# Patient Record
Sex: Female | Born: 1945 | Race: White | Hispanic: No | Marital: Married | Smoking: Never smoker
Health system: Southern US, Community
[De-identification: ages and names within clinical notes are randomized; demographics above are authoritative.]

## PROBLEM LIST (undated history)

## (undated) DIAGNOSIS — E039 Hypothyroidism, unspecified: Secondary | ICD-10-CM

## (undated) DIAGNOSIS — E119 Type 2 diabetes mellitus without complications: Secondary | ICD-10-CM

## (undated) HISTORY — DX: Hypothyroidism, unspecified: E03.9

## (undated) HISTORY — DX: Type 2 diabetes mellitus without complications: E11.9

## (undated) HISTORY — PX: CHOLECYSTECTOMY: SHX55

## (undated) HISTORY — PX: ABDOMINAL HYSTERECTOMY: SHX81

---

## 2017-09-30 LAB — TSH: TSH: 3.42 (ref <=5.90)

## 2018-01-10 LAB — LIPID PANEL
Cholesterol: 232 — AB (ref 0–200)
HDL: 51 (ref 35–70)
LDL CALC: 154
TRIGLYCERIDES: 161 — AB (ref 40–160)

## 2018-02-17 LAB — HEMOGLOBIN A1C: HEMOGLOBIN A1C: 7.5

## 2018-02-17 LAB — LIPID PANEL
Cholesterol: 201 — AB (ref 0–200)
HDL: 52 (ref 35–70)
LDL Cholesterol: 138
Triglycerides: 167 — AB (ref 40–160)

## 2018-02-17 LAB — BASIC METABOLIC PANEL
BUN: 13 (ref 4–21)
Creatinine: 1.3 — AB (ref 0.5–1.1)

## 2018-04-20 ENCOUNTER — Ambulatory Visit (INDEPENDENT_AMBULATORY_CARE_PROVIDER_SITE_OTHER): Payer: Medicare Other | Admitting: "Endocrinology

## 2018-04-20 ENCOUNTER — Encounter: Payer: Self-pay | Admitting: "Endocrinology

## 2018-04-20 VITALS — BP 119/71 | HR 84 | Ht 64.0 in | Wt 201.0 lb

## 2018-04-20 DIAGNOSIS — E559 Vitamin D deficiency, unspecified: Secondary | ICD-10-CM | POA: Diagnosis not present

## 2018-04-20 DIAGNOSIS — E039 Hypothyroidism, unspecified: Secondary | ICD-10-CM

## 2018-04-20 DIAGNOSIS — E782 Mixed hyperlipidemia: Secondary | ICD-10-CM | POA: Insufficient documentation

## 2018-04-20 DIAGNOSIS — I10 Essential (primary) hypertension: Secondary | ICD-10-CM

## 2018-04-20 DIAGNOSIS — E1165 Type 2 diabetes mellitus with hyperglycemia: Secondary | ICD-10-CM | POA: Diagnosis not present

## 2018-04-20 MED ORDER — LEVOTHYROXINE SODIUM 75 MCG PO TABS: 75.0000 ug | ORAL_TABLET | Freq: Every day | ORAL | 1 refills | Status: DC

## 2018-04-20 MED ORDER — METFORMIN HCL ER 500 MG PO TB24: 500.0000 mg | ORAL_TABLET | Freq: Every day | ORAL | 2 refills | Status: DC

## 2018-04-20 MED ORDER — ATORVASTATIN CALCIUM 20 MG PO TABS: 20.0000 mg | ORAL_TABLET | Freq: Every day | ORAL | 1 refills | Status: DC

## 2018-04-20 NOTE — Progress Notes (Signed)
Endocrinology Consult Note       04/20/2018, 11:32 AM   Subjective:    Patient ID: Carol Adams, female    DOB: [birth year].  Carol Adams is being seen in consultation for management of currently uncontrolled symptomatic diabetes requested by  Lamont Snowball, MD.   Past Medical History:  Diagnosis Date  . Diabetes mellitus, type II (HCC)   . Hypothyroidism    Past Surgical History:  Procedure Laterality Date  . ABDOMINAL HYSTERECTOMY    . CHOLECYSTECTOMY     Social History   Socioeconomic History  . Marital status: Married    Spouse name: Not on file  . Number of children: Not on file  . Years of education: Not on file  . Highest education level: Not on file  Occupational History  . Not on file  Social Needs  . Financial resource strain: Not on file  . Food insecurity:    Worry: Not on file    Inability: Not on file  . Transportation needs:    Medical: Not on file    Non-medical: Not on file  Tobacco Use  . Smoking status: Never Smoker  . Smokeless tobacco: Never Used  Substance and Sexual Activity  . Alcohol use: Never    Frequency: Never  . Drug use: Never  . Sexual activity: Not on file  Lifestyle  . Physical activity:    Days per week: Not on file    Minutes per session: Not on file  . Stress: Not on file  Relationships  . Social connections:    Talks on phone: Not on file    Gets together: Not on file    Attends religious service: Not on file    Active member of club or organization: Not on file    Attends meetings of clubs or organizations: Not on file    Relationship status: Not on file  Other Topics Concern  . Not on file  Social History Narrative  . Not on file   Outpatient Encounter Medications as of 04/20/2018  Medication Sig  . loratadine (CLARITIN) 10 MG tablet Take 10 mg by mouth daily.  . Multiple Vitamin (MULTIVITAMIN) capsule Take 1 capsule by mouth daily.   Marland Kitchen PARoxetine (PAXIL) 20 MG tablet Take 20 mg by mouth daily.  . Vitamin D, Ergocalciferol, 2000 units CAPS Take by mouth daily.  Marland Kitchen atorvastatin (LIPITOR) 20 MG tablet Take 1 tablet (20 mg total) by mouth daily.  Marland Kitchen levothyroxine (SYNTHROID, LEVOTHROID) 75 MCG tablet Take 1 tablet (75 mcg total) by mouth daily before breakfast.  . metFORMIN (GLUCOPHAGE XR) 500 MG 24 hr tablet Take 1 tablet (500 mg total) by mouth daily with breakfast.  . omeprazole (PRILOSEC) 20 MG capsule daily.  . trandolapril (MAVIK) 4 MG tablet daily as needed.  . [DISCONTINUED] levothyroxine (SYNTHROID, LEVOTHROID) 50 MCG tablet daily.  . [DISCONTINUED] metFORMIN (GLUCOPHAGE) 500 MG tablet 2 (two) times daily.   No facility-administered encounter medications on file as of 04/20/2018.     ALLERGIES: Allergies  Allergen Reactions  . Cephalexin Rash    VACCINATION STATUS:  There is no immunization history on file  for this patient.  Diabetes  She presents for her initial diabetic visit. She has type 2 diabetes mellitus. Onset time: She was recently diagnosed with type 2 diabetes at age 68 years. Her disease course has been improving. There are no hypoglycemic associated symptoms. Pertinent negatives for hypoglycemia include no confusion, headaches, pallor or seizures. There are no diabetic associated symptoms. Pertinent negatives for diabetes include no chest pain, no polydipsia, no polyphagia and no polyuria. There are no hypoglycemic complications. Symptoms are stable. Diabetic complications include nephropathy. Risk factors for coronary artery disease include diabetes mellitus, dyslipidemia, hypertension, obesity, sedentary lifestyle and post-menopausal. Current diabetic treatment includes oral agent (monotherapy). Her weight is fluctuating minimally. She is following a generally unhealthy diet. When asked about meal planning, she reported none. She has not had a previous visit with a dietitian. She participates in  exercise intermittently. Her home blood glucose trend is decreasing steadily. Her overall blood glucose range is 130-140 mg/dl. An ACE inhibitor/angiotensin II receptor blocker is being taken.  Hyperlipidemia  This is a chronic problem. The current episode started more than 1 year ago. The problem is uncontrolled. Recent lipid tests were reviewed and are variable. Exacerbating diseases include diabetes, hypothyroidism and obesity. Pertinent negatives include no chest pain, myalgias or shortness of breath. She is currently on no antihyperlipidemic treatment. Risk factors for coronary artery disease include dyslipidemia, diabetes mellitus, hypertension, obesity, post-menopausal, a sedentary lifestyle and family history.  Hypertension  This is a chronic problem. The current episode started more than 1 year ago. Pertinent negatives include no chest pain, headaches, palpitations or shortness of breath. Risk factors for coronary artery disease include diabetes mellitus, dyslipidemia and sedentary lifestyle. Past treatments include ACE inhibitors. Hypertensive end-organ damage includes kidney disease.      Review of Systems  Constitutional: Negative for chills, fever and unexpected weight change.  HENT: Negative for trouble swallowing and voice change.   Eyes: Negative for visual disturbance.  Respiratory: Negative for cough, shortness of breath and wheezing.   Cardiovascular: Negative for chest pain, palpitations and leg swelling.  Gastrointestinal: Negative for diarrhea, nausea and vomiting.  Endocrine: Negative for cold intolerance, heat intolerance, polydipsia, polyphagia and polyuria.  Musculoskeletal: Negative for arthralgias and myalgias.  Skin: Negative for color change, pallor, rash and wound.  Neurological: Negative for seizures and headaches.  Psychiatric/Behavioral: Negative for confusion and suicidal ideas.    Objective:    BP 119/71   Pulse 84   Ht 5\' 4"  (1.626 m)   Wt 201 lb (91.2  kg)   BMI 34.50 kg/m   Wt Readings from Last 3 Encounters:  04/20/18 201 lb (91.2 kg)     Physical Exam  Constitutional: She is oriented to person, place, and time. She appears well-developed.  HENT:  Head: Normocephalic and atraumatic.  Eyes: EOM are normal.  Neck: Normal range of motion. Neck supple. No tracheal deviation present. No thyromegaly present.  Cardiovascular: Normal rate and regular rhythm.  Pulmonary/Chest: Effort normal and breath sounds normal.  Abdominal: Soft. Bowel sounds are normal. There is no tenderness. There is no guarding.  Musculoskeletal: Normal range of motion. She exhibits no edema.  Neurological: She is alert and oriented to person, place, and time. She has normal reflexes. No cranial nerve deficit. Coordination normal.  Skin: Skin is warm and dry. No rash noted. No erythema. No pallor.  Psychiatric: She has a normal mood and affect. Judgment normal.    Recent Results (from the past 2160 hour(s))  Basic metabolic panel  Status: Abnormal   Collection Time: 02/17/18 12:00 AM  Result Value Ref Range   BUN 13 4 - 21   Creatinine 1.3 (A) 0.5 - 1.1  Lipid panel     Status: Abnormal   Collection Time: 02/17/18 12:00 AM  Result Value Ref Range   Triglycerides 167 (A) 40 - 160   Cholesterol 201 (A) 0 - 200   HDL 52 35 - 70   LDL Cholesterol 138   Hemoglobin A1c     Status: None   Collection Time: 02/17/18 12:00 AM  Result Value Ref Range   Hemoglobin A1C 7.5      Assessment & Plan:   1. Uncontrolled type 2 diabetes mellitus with hyperglycemia (HCC)  - Carol Adams has currently uncontrolled symptomatic type 2 DM since 72 years of age,  with most recent A1c of 7.5 %. Recent labs reviewed.  -her diabetes is complicated by stage 3 renal insufficiency, obesity/sedentary life and she remains at a high risk for more acute and chronic complications which include CAD, CVA, CKD, retinopathy, and neuropathy. These are all discussed in detail with her.  -  I have counseled her on diet management and weight loss, by adopting a carbohydrate restricted/protein rich diet.  - Suggestion is made for her to avoid simple carbohydrates  from her diet including Cakes, Sweet Desserts, Ice Cream, Soda (diet and regular), Sweet Tea, Candies, Chips, Cookies, Store Bought Juices, Alcohol in Excess of  1-2 drinks a day, Artificial Sweeteners,  Coffee Creamer, and "Sugar-free" Products. This will help patient to have more stable blood glucose profile and potentially avoid unintended weight gain.  - I encouraged her to switch to  unprocessed or minimally processed complex starch and increased protein intake (animal or plant source), fruits, and vegetables.  - she is advised to stick to a routine mealtimes to eat 3 meals  a day and avoid unnecessary snacks ( to snack only to correct hypoglycemia).   - she will be scheduled with Norm Salt, RDN, CDE for individualized diabetes education.  - I have approached her with the following individualized plan to manage diabetes and patient agrees:   -Given her recent diagnosis of diabetes and near target glycemic profile with A1c of 7.5%, she would not require insulin treatment at this time.   -She is advised to maintain adequate hydration, avoid over-the-counter NSAIDs to stabilize her kidney function.   -She will continue to benefit from metformin therapy.  I discussed and switched her metformin to extended release form to take 500 mg ER twice a day after breakfast and supper.    -She will return in 9 weeks with repeat A1c, will be considered for GLP-1 receptor agonists if she cannot tolerate metformin and/ or if her A1c continues to increase.  -She is advised to take a break from finger sticking at this time.  - she is not a candidate for SGLT2 inhibitors due to CKD.  - she will be considered for incretin therapy as appropriate next visit. - Patient specific target  A1c;  LDL, HDL, Triglycerides, and  Waist  Circumference were discussed in detail.  2) BP/HTN:  her blood pressure is controlled to target.   she is advised to continue her current medications including trandolapril 4 mg p.o. daily with breakfast . 3) Lipids/HPL:   Review of her recent lipid panel showed uncontrolled  LDL at 138.  she will benefit from statin therapy.  I discussed and initiated atorvastatin 20 mg p.o. nightly.  Side effects  and precautions discussed with her.  4)  Weight/Diet:  Body mass index is 34.5 kg/m.  - clearly complicating her diabetes care.  I discussed with her the fact that loss of 5 - 10% of her  current body weight will have the most impact on her diabetes management.  CDE Consult will be initiated . Exercise, and detailed carbohydrates information provided  -  detailed on discharge instructions.  5) hypothyroidism-long-term diagnosis, took levothyroxine for more than 10 years. -Recent labs showed high normal TSH of 3.4.  Benefit from a higher dose of levothyroxine. -I discussed and increase her levothyroxine to 75 mcg p.o. every morning.  - We discussed about correct intake of levothyroxine, at fasting, with water, separated by at least 30 minutes from breakfast, and separated by more than 4 hours from calcium, iron, multivitamins, acid reflux medications (PPIs). -Patient is made aware of the fact that thyroid hormone replacement is needed for life, dose to be adjusted by periodic monitoring of thyroid function tests.  5) Chronic Care/Health Maintenance:  -she  is on ACEI/ARB and Statin medications and  is encouraged to initiate and continue to follow up with Ophthalmology, Dentist,  Podiatrist at least yearly or according to recommendations, and advised to  stay away from smoking. I have recommended yearly flu vaccine and pneumonia vaccine at least every 5 years; moderate intensity exercise for up to 150 minutes weekly; and  sleep for at least 7 hours a day.  - I advised patient to maintain close follow up  with Lamont Snowball, MD for primary care needs.  - Time spent with the patient: 45 minutes, of which >50% was spent in obtaining information about her symptoms, reviewing her previous labs, evaluations, and treatments, counseling her about her type 2 diabetes, hypothyroidism, hyperlipidemia, hypertension, and developing plans for long term treatment based on the latest recommendations.  Carol Adams participated in the discussions, expressed understanding, and voiced agreement with the above plans.  All questions were answered to her satisfaction. she is encouraged to contact clinic should she have any questions or concerns prior to her return visit.  Follow up plan: - Return in about 9 weeks (around 06/22/2018) for Follow up with Pre-visit Labs.  Marquis Lunch, MD [facility] [address] [place], [place] [address] Phone: [phone]  Fax: [phone]    04/20/2018, 11:32 AM  This note was partially dictated with voice recognition software. Similar sounding words can be transcribed inadequately or may not  be corrected upon review.

## 2018-04-20 NOTE — Patient Instructions (Signed)

## 2018-06-15 ENCOUNTER — Other Ambulatory Visit: Payer: Self-pay | Admitting: "Endocrinology

## 2018-06-16 ENCOUNTER — Other Ambulatory Visit: Payer: Self-pay

## 2018-06-16 ENCOUNTER — Other Ambulatory Visit: Payer: Self-pay | Admitting: "Endocrinology

## 2018-06-16 LAB — CMP14+EGFR
A/G RATIO: 1.8 (ref 1.2–2.2)
ALBUMIN: 4.4 g/dL (ref 3.5–4.8)
ALT: 23 IU/L (ref 0–32)
AST: 20 IU/L (ref 0–40)
Alkaline Phosphatase: 100 IU/L (ref 39–117)
BUN / CREAT RATIO: 15 (ref 12–28)
BUN: 18 mg/dL (ref 8–27)
Bilirubin Total: 0.5 mg/dL (ref 0.0–1.2)
CALCIUM: 8.8 mg/dL (ref 8.7–10.3)
CO2: 24 mmol/L (ref 20–29)
Chloride: 103 mmol/L (ref 96–106)
Creatinine, Ser: 1.22 mg/dL — ABNORMAL HIGH (ref 0.57–1.00)
GFR, EST AFRICAN AMERICAN: 51 mL/min/{1.73_m2} — AB (ref >59)
GFR, EST NON AFRICAN AMERICAN: 44 mL/min/{1.73_m2} — AB (ref >59)
GLUCOSE: 143 mg/dL — AB (ref 65–99)
Globulin, Total: 2.5 g/dL (ref 1.5–4.5)
Potassium: 4.3 mmol/L (ref 3.5–5.2)
Sodium: 141 mmol/L (ref 134–144)
TOTAL PROTEIN: 6.9 g/dL (ref 6.0–8.5)

## 2018-06-16 LAB — MICROALBUMIN / CREATININE URINE RATIO
CREATININE, UR: 158.4 mg/dL
Microalb/Creat Ratio: 8.6 mg/g creat (ref 0.0–30.0)
Microalbumin, Urine: 13.6 ug/mL

## 2018-06-16 LAB — THYROGLOBULIN ANTIBODY: Thyroglobulin Antibody: <1 IU/mL (ref 0.0–0.9)

## 2018-06-16 LAB — THYROID PEROXIDASE ANTIBODY: Thyroperoxidase Ab SerPl-aCnc: 9 IU/mL (ref 0–34)

## 2018-06-16 LAB — T4, FREE: Free T4: 1.63 ng/dL (ref 0.82–1.77)

## 2018-06-16 LAB — HGB A1C W/O EAG: Hgb A1c MFr Bld: 7.1 % — ABNORMAL HIGH (ref 4.8–5.6)

## 2018-06-16 LAB — VITAMIN D 25 HYDROXY (VIT D DEFICIENCY, FRACTURES): Vit D, 25-Hydroxy: 55.8 ng/mL (ref 30.0–100.0)

## 2018-06-16 LAB — TSH: TSH: 1.53 u[IU]/mL (ref 0.450–4.500)

## 2018-06-16 MED ORDER — ATORVASTATIN CALCIUM 20 MG PO TABS: 20.0000 mg | ORAL_TABLET | Freq: Every day | ORAL | 1 refills | Status: DC

## 2018-06-24 ENCOUNTER — Ambulatory Visit (INDEPENDENT_AMBULATORY_CARE_PROVIDER_SITE_OTHER): Payer: Medicare Other | Admitting: "Endocrinology

## 2018-06-24 ENCOUNTER — Encounter: Payer: Self-pay | Admitting: "Endocrinology

## 2018-06-24 VITALS — BP 143/80 | HR 76 | Resp 14 | Ht 64.0 in | Wt 201.2 lb

## 2018-06-24 DIAGNOSIS — N183 Chronic kidney disease, stage 3 unspecified: Secondary | ICD-10-CM

## 2018-06-24 DIAGNOSIS — E039 Hypothyroidism, unspecified: Secondary | ICD-10-CM | POA: Diagnosis not present

## 2018-06-24 DIAGNOSIS — I10 Essential (primary) hypertension: Secondary | ICD-10-CM | POA: Diagnosis not present

## 2018-06-24 DIAGNOSIS — E782 Mixed hyperlipidemia: Secondary | ICD-10-CM | POA: Diagnosis not present

## 2018-06-24 DIAGNOSIS — E1122 Type 2 diabetes mellitus with diabetic chronic kidney disease: Secondary | ICD-10-CM

## 2018-06-24 NOTE — Progress Notes (Signed)
Endocrinology follow up Note       06/24/2018, 11:07 AM   Subjective:    Patient ID: Carol Adams, female    DOB: [birth year].  Carol Adams is being seen in consultation for management of currently uncontrolled symptomatic type 2 diabetes, hypothyroidism, hyperlipidemia, hypertension. PMD:   Tommie Sams, MD.   Past Medical History:  Diagnosis Date  . Diabetes mellitus, type II ([place])   . Hypothyroidism    Past Surgical History:  Procedure Laterality Date  . ABDOMINAL HYSTERECTOMY    . CHOLECYSTECTOMY     Social History   Socioeconomic History  . Marital status: Married    Spouse name: Not on file  . Number of children: Not on file  . Years of education: Not on file  . Highest education level: Not on file  Occupational History  . Not on file  Social Needs  . Financial resource strain: Not on file  . Food insecurity:    Worry: Not on file    Inability: Not on file  . Transportation needs:    Medical: Not on file    Non-medical: Not on file  Tobacco Use  . Smoking status: Never Smoker  . Smokeless tobacco: Never Used  Substance and Sexual Activity  . Alcohol use: Never    Frequency: Never  . Drug use: Never  . Sexual activity: Not on file  Lifestyle  . Physical activity:    Days per week: Not on file    Minutes per session: Not on file  . Stress: Not on file  Relationships  . Social connections:    Talks on phone: Not on file    Gets together: Not on file    Attends religious service: Not on file    Active member of club or organization: Not on file    Attends meetings of clubs or organizations: Not on file    Relationship status: Not on file  Other Topics Concern  . Not on file  Social History Narrative  . Not on file   Outpatient Encounter Medications as of 06/24/2018  Medication Sig  . atorvastatin (LIPITOR) 20 MG tablet TAKE 1 TABLET(20 MG) BY MOUTH DAILY  .  levothyroxine (SYNTHROID, LEVOTHROID) 75 MCG tablet Take 1 tablet (75 mcg total) by mouth daily before breakfast.  . loratadine (CLARITIN) 10 MG tablet Take 10 mg by mouth daily.  . metFORMIN (GLUCOPHAGE XR) 500 MG 24 hr tablet Take 1 tablet (500 mg total) by mouth daily with breakfast.  . Multiple Vitamin (MULTIVITAMIN) capsule Take 1 capsule by mouth daily.  Marland Kitchen omeprazole (PRILOSEC) 20 MG capsule daily.  Marland Kitchen PARoxetine (PAXIL) 20 MG tablet Take 20 mg by mouth daily.  . trandolapril (MAVIK) 4 MG tablet daily as needed.  . Vitamin D, Ergocalciferol, 2000 units CAPS Take by mouth daily.   No facility-administered encounter medications on file as of 06/24/2018.     ALLERGIES: Allergies  Allergen Reactions  . Cephalexin Rash    VACCINATION STATUS:  There is no immunization history on file for this patient.  Diabetes  She presents for her follow-up diabetic visit. She has type 2 diabetes  mellitus. Onset time: She was recently diagnosed with type 2 diabetes at age 1 years. Her disease course has been improving. There are no hypoglycemic associated symptoms. Pertinent negatives for hypoglycemia include no confusion, headaches, pallor or seizures. There are no diabetic associated symptoms. Pertinent negatives for diabetes include no chest pain, no polydipsia, no polyphagia and no polyuria. There are no hypoglycemic complications. Symptoms are stable. Diabetic complications include nephropathy. Risk factors for coronary artery disease include diabetes mellitus, dyslipidemia, hypertension, obesity, sedentary lifestyle and post-menopausal. Current diabetic treatment includes oral agent (monotherapy). Her weight is stable. She is following a generally unhealthy diet. When asked about meal planning, she reported none. She has not had a previous visit with a dietitian. She participates in exercise intermittently. Her home blood glucose trend is decreasing steadily. An ACE inhibitor/angiotensin II receptor  blocker is being taken.  Hyperlipidemia  This is a chronic problem. The current episode started more than 1 year ago. The problem is uncontrolled. Recent lipid tests were reviewed and are variable. Exacerbating diseases include diabetes, hypothyroidism and obesity. Pertinent negatives include no chest pain, myalgias or shortness of breath. She is currently on no antihyperlipidemic treatment. Risk factors for coronary artery disease include dyslipidemia, diabetes mellitus, hypertension, obesity, post-menopausal, a sedentary lifestyle and family history.  Hypertension  This is a chronic problem. The current episode started more than 1 year ago. Pertinent negatives include no chest pain, headaches, palpitations or shortness of breath. Risk factors for coronary artery disease include diabetes mellitus, dyslipidemia and sedentary lifestyle. Past treatments include ACE inhibitors. Hypertensive end-organ damage includes kidney disease.      Review of Systems  Constitutional: Negative for chills, fever and unexpected weight change.  HENT: Negative for trouble swallowing and voice change.   Eyes: Negative for visual disturbance.  Respiratory: Negative for cough, shortness of breath and wheezing.   Cardiovascular: Negative for chest pain, palpitations and leg swelling.  Gastrointestinal: Negative for diarrhea, nausea and vomiting.  Endocrine: Negative for cold intolerance, heat intolerance, polydipsia, polyphagia and polyuria.  Musculoskeletal: Negative for arthralgias and myalgias.  Skin: Negative for color change, pallor, rash and wound.  Neurological: Negative for seizures and headaches.  Psychiatric/Behavioral: Negative for confusion and suicidal ideas.    Objective:    BP (!) 143/80 (BP Location: Left Arm, Patient Position: Sitting, Cuff Size: Large)   Pulse 76   Resp 14   Ht 5' 4"  (1.626 m)   Wt 201 lb 3.2 oz (91.3 kg)   SpO2 98% Comment: room air  BMI 34.54 kg/m   Wt Readings from Last  3 Encounters:  06/24/18 201 lb 3.2 oz (91.3 kg)  04/20/18 201 lb (91.2 kg)     Physical Exam Constitutional:      Appearance: She is well-developed.  HENT:     Head: Normocephalic and atraumatic.  Neck:     Musculoskeletal: Normal range of motion and neck supple.     Thyroid: No thyromegaly.     Trachea: No tracheal deviation.  Pulmonary:     Effort: Pulmonary effort is normal.  Abdominal:     Tenderness: There is no abdominal tenderness. There is no guarding.  Musculoskeletal: Normal range of motion.  Skin:    General: Skin is warm and dry.     Coloration: Skin is not pale.     Findings: No erythema or rash.  Neurological:     Mental Status: She is alert and oriented to person, place, and time.     Cranial Nerves: No cranial  nerve deficit.     Coordination: Coordination normal.     Deep Tendon Reflexes: Reflexes are normal and symmetric.  Psychiatric:        Judgment: Judgment normal.     Recent Results (from the past 2160 hour(s))  CMP14+EGFR     Status: Abnormal   Collection Time: 06/15/18  9:41 AM  Result Value Ref Range   Glucose 143 (H) 65 - 99 mg/dL   BUN 18 8 - 27 mg/dL   Creatinine, Ser 1.22 (H) 0.57 - 1.00 mg/dL   GFR calc non Af Amer 44 (L) >59 mL/min/1.73   GFR calc Af Amer 51 (L) >59 mL/min/1.73   BUN/Creatinine Ratio 15 12 - 28   Sodium 141 134 - 144 mmol/L   Potassium 4.3 3.5 - 5.2 mmol/L   Chloride 103 96 - 106 mmol/L   CO2 24 20 - 29 mmol/L   Calcium 8.8 8.7 - 10.3 mg/dL   Total Protein 6.9 6.0 - 8.5 g/dL   Albumin 4.4 3.5 - 4.8 g/dL   Globulin, Total 2.5 1.5 - 4.5 g/dL   Albumin/Globulin Ratio 1.8 1.2 - 2.2   Bilirubin Total 0.5 0.0 - 1.2 mg/dL   Alkaline Phosphatase 100 39 - 117 IU/L   AST 20 0 - 40 IU/L   ALT 23 0 - 32 IU/L  Microalbumin / creatinine urine ratio     Status: None   Collection Time: 06/15/18  9:41 AM  Result Value Ref Range   Creatinine, Urine 158.4 Not Estab. mg/dL   Microalbumin, Urine 13.6 Not Estab. ug/mL    Microalb/Creat Ratio 8.6 0.0 - 30.0 mg/g creat    Comment:                      Normal:                0.0 -  30.0                      Albuminuria:          31.0 - 300.0                      Clinical albuminuria:       >300.0   Hgb A1c w/o eAG     Status: Abnormal   Collection Time: 06/15/18  9:41 AM  Result Value Ref Range   Hgb A1c MFr Bld 7.1 (H) 4.8 - 5.6 %    Comment:          Prediabetes: 5.7 - 6.4          Diabetes: >6.4          Glycemic control for adults with diabetes: <7.0   T4, free     Status: None   Collection Time: 06/15/18  9:41 AM  Result Value Ref Range   Free T4 1.63 0.82 - 1.77 ng/dL  TSH     Status: None   Collection Time: 06/15/18  9:41 AM  Result Value Ref Range   TSH 1.530 0.450 - 4.500 uIU/mL  VITAMIN D 25 Hydroxy (Vit-D Deficiency, Fractures)     Status: None   Collection Time: 06/15/18  9:41 AM  Result Value Ref Range   Vit D, 25-Hydroxy 55.8 30.0 - 100.0 ng/mL    Comment: Vitamin D deficiency has been defined by the Institute of Medicine and an Endocrine Society practice guideline as a level of serum 25-OH vitamin D less than  20 ng/mL (1,2). The Endocrine Society went on to further define vitamin D insufficiency as a level between 21 and 29 ng/mL (2). 1. IOM (Institute of Medicine). 2010. Dietary reference    intakes for calcium and D. [place]: The    [employer]. 2. Holick MF, Binkley Throckmorton, Bischoff-Ferrari HA, et al.    Evaluation, treatment, and prevention of vitamin D    deficiency: an Endocrine Society clinical practice    guideline. JCEM. 2011 Jul; 96(7):1911-30.   Thyroglobulin antibody     Status: None   Collection Time: 06/15/18  9:41 AM  Result Value Ref Range   Thyroglobulin Antibody <1.0 0.0 - 0.9 IU/mL    Comment: Thyroglobulin Antibody measured by [employer]man Coulter Methodology  Thyroid peroxidase antibody     Status: None   Collection Time: 06/15/18  9:41 AM  Result Value Ref Range   Thyroperoxidase Ab SerPl-aCnc  9 0 - 34 IU/mL     Assessment & Plan:   1. Uncontrolled type 2 diabetes mellitus with Stage 3 renal   - Matrice Herro has currently uncontrolled symptomatic type 2 DM since 72 years of age.  She returns with improved A1c was 7.1% from 7.5%.  Her recent labs show stage 3 renal insufficiency which is showing improvement.  -her diabetes is complicated by stag e 3 renal insufficiency,obesity/sedentary life and she remains at a high risk for more acute and chronic complications which include CAD, CVA, CKD, retinopathy, and neuropathy. These are all discussed in detail with her.  - I have counseled her on diet management and weight loss, by adopting a carbohydrate restricted/protein rich diet.  -  Suggestion is made for her to avoid simple carbohydrates  from her diet including Cakes, Sweet Desserts / Pastries, Ice Cream, Soda (diet and regular), Sweet Tea, Candies, Chips, Cookies, Store Bought Juices, Alcohol in Excess of  1-2 drinks a day, Artificial Sweeteners, and "Sugar-free" Products. This will help patient to have stable blood glucose profile and potentially avoid unintended weight gain.  - I encouraged her to switch to  unprocessed or minimally processed complex starch and increased protein intake (animal or plant source), fruits, and vegetables.  - she is advised to stick to a routine mealtimes to eat 3 meals  a day and avoid unnecessary snacks ( to snack only to correct hypoglycemia).   - I have approached her with the following individualized plan to manage diabetes and patient agrees:   -Given her recent diagnosis of diabetes and near target glycemic profile with A1c of 7.1%, she will not require insulin treatment at this time.   -She is advised to maintain adequate hydration, avoid over-the-counter NSAIDs to stabilize her kidney function.   -She will continue to benefit from metformin therapy.  I discussed and continued  metformin extended release  500 mg ER once a day after breakfast and  supper.    -She  will be considered for GLP-1 receptor agonists if she cannot tolerate metformin and/ or if her A1c continues to increase.  -She is advised to take a break from finger sticking at this time.  - she is not a candidate for SGLT2 inhibitors due to CKD.  - she will be considered for incretin therapy as appropriate next visit. - Patient specific target  A1c;  LDL, HDL, Triglycerides, and  Waist Circumference were discussed in detail.  2) BP/HTN:  her blood pressure is not controlled to target.  she is advised to continue her current medications including trandolapril 4  mg p.o. daily with breakfast . 3) Lipids/HPL:   Review of her recent lipid panel showed uncontrolled  LDL at 138.  she will benefit from statin therapy.  She is tolerating atorvastatin 20 mg p.o. nightly.  She will be considered for fasting lipid panel on subsequent visits.  4)  Weight/Diet:  Body mass index is 34.54 kg/m.  - clearly complicating her diabetes care.  I discussed with her the fact that loss of 5 - 10% of her  current body weight will have the most impact on her diabetes management.  CDE Consult has been initiated . Exercise, and detailed carbohydrates information provided  -  detailed on discharge instructions.  5) hypothyroidism-long-term diagnosis, took levothyroxine for more than 10 years. -Her thyroid function tests are consistent with appropriate replacement. -He is advised to continue  levothyroxine to 75 mcg p.o. every morning.  - We discussed about correct intake of levothyroxine, at fasting, with water, separated by at least 30 minutes from breakfast, and separated by more than 4 hours from calcium, iron, multivitamins, acid reflux medications (PPIs). -Patient is made aware of the fact that thyroid hormone replacement is needed for life, dose to be adjusted by periodic monitoring of thyroid function tests.  5) Chronic Care/Health Maintenance:  -she  is on ACEI/ARB and Statin medications and  is  encouraged to initiate and continue to follow up with Ophthalmology, Dentist,  Podiatrist at least yearly or according to recommendations, and advised to  stay away from smoking. I have recommended yearly flu vaccine and pneumonia vaccine at least every 5 years; moderate intensity exercise for up to 150 minutes weekly; and  sleep for at least 7 hours a day.  - I advised patient to maintain close follow up with Tommie Sams, MD for primary care needs.  - Time spent with the patient: 25 min, of which >50% was spent in reviewing her  current and  previous labs, previous treatments, and medications doses and developing a plan for long-term care.  Candise Bowens participated in the discussions, expressed understanding, and voiced agreement with the above plans.  All questions were answered to her satisfaction. she is encouraged to contact clinic should she have any questions or concerns prior to her return visit.  Follow up plan: - Return in about 4 months (around 10/24/2018) for Follow up with Pre-visit Labs.  Glade Lloyd, MD [facility] [address] [place], [place] [address] Phone: [phone]  Fax: [phone]    06/24/2018, 11:07 AM  This note was partially dictated with voice recognition software. Similar sounding words can be transcribed inadequately or may not  be corrected upon review.

## 2018-06-24 NOTE — Patient Instructions (Signed)

## 2018-10-17 ENCOUNTER — Other Ambulatory Visit: Payer: Self-pay | Admitting: "Endocrinology

## 2018-10-18 LAB — COMPREHENSIVE METABOLIC PANEL
ALT: 23 IU/L (ref 0–32)
AST: 19 IU/L (ref 0–40)
Albumin/Globulin Ratio: 1.6 (ref 1.2–2.2)
Albumin: 4.3 g/dL (ref 3.7–4.7)
Alkaline Phosphatase: 116 IU/L (ref 39–117)
BUN/Creatinine Ratio: 14 (ref 12–28)
BUN: 18 mg/dL (ref 8–27)
Bilirubin Total: 0.5 mg/dL (ref 0.0–1.2)
CO2: 24 mmol/L (ref 20–29)
Calcium: 9.2 mg/dL (ref 8.7–10.3)
Chloride: 102 mmol/L (ref 96–106)
Creatinine, Ser: 1.3 mg/dL — ABNORMAL HIGH (ref 0.57–1.00)
GFR calc Af Amer: 47 mL/min/{1.73_m2} — ABNORMAL LOW (ref >59)
GFR calc non Af Amer: 41 mL/min/{1.73_m2} — ABNORMAL LOW (ref >59)
Globulin, Total: 2.7 g/dL (ref 1.5–4.5)
Glucose: 145 mg/dL — ABNORMAL HIGH (ref 65–99)
Potassium: 4.4 mmol/L (ref 3.5–5.2)
Sodium: 142 mmol/L (ref 134–144)
Total Protein: 7 g/dL (ref 6.0–8.5)

## 2018-10-18 LAB — TSH+FREE T4
Free T4: 1.6 ng/dL (ref 0.82–1.77)
TSH: 1.5 u[IU]/mL (ref 0.450–4.500)

## 2018-10-18 LAB — HGB A1C W/O EAG: Hgb A1c MFr Bld: 7.2 % — ABNORMAL HIGH (ref 4.8–5.6)

## 2018-10-18 LAB — HEMOGLOBIN A1C: Hemoglobin A1C: 7.2

## 2018-10-24 ENCOUNTER — Other Ambulatory Visit: Payer: Self-pay

## 2018-10-24 ENCOUNTER — Encounter: Payer: Self-pay | Admitting: "Endocrinology

## 2018-10-24 ENCOUNTER — Other Ambulatory Visit: Payer: Self-pay | Admitting: "Endocrinology

## 2018-10-24 ENCOUNTER — Ambulatory Visit (INDEPENDENT_AMBULATORY_CARE_PROVIDER_SITE_OTHER): Payer: Medicare Other | Admitting: "Endocrinology

## 2018-10-24 DIAGNOSIS — E1122 Type 2 diabetes mellitus with diabetic chronic kidney disease: Secondary | ICD-10-CM | POA: Diagnosis not present

## 2018-10-24 DIAGNOSIS — E039 Hypothyroidism, unspecified: Secondary | ICD-10-CM | POA: Diagnosis not present

## 2018-10-24 DIAGNOSIS — E782 Mixed hyperlipidemia: Secondary | ICD-10-CM

## 2018-10-24 DIAGNOSIS — I10 Essential (primary) hypertension: Secondary | ICD-10-CM | POA: Diagnosis not present

## 2018-10-24 DIAGNOSIS — N183 Chronic kidney disease, stage 3 (moderate): Secondary | ICD-10-CM

## 2018-10-24 MED ORDER — OMEPRAZOLE 20 MG PO CPDR: 20.0000 mg | DELAYED_RELEASE_CAPSULE | Freq: Every day | ORAL | 1 refills | Status: DC

## 2018-10-24 NOTE — Progress Notes (Signed)
10/24/2018, 5:14 PM                                Endocrinology Telehealth Visit Follow up Note -During COVID -19 Pandemic  I connected with@ on 10/24/2018   by telephone and verified that I am speaking with the correct person using two identifiers. Carol Adams, 05-23-1946. she has verbally consented to this visit. All issues noted in this document were discussed and addressed. The format was not optimal for physical exam.     Subjective:    Patient ID: Carol Adams, female    DOB: 02/22/1946.  Carol Adams is being engaged in telehealth in consultation for management of currently uncontrolled symptomatic type 2 diabetes, hypothyroidism, hyperlipidemia, hypertension. PMD:   Lamont Snowball, MD.   Past Medical History:  Diagnosis Date  . Diabetes mellitus, type II (HCC)   . Hypothyroidism    Past Surgical History:  Procedure Laterality Date  . ABDOMINAL HYSTERECTOMY    . CHOLECYSTECTOMY     Social History   Socioeconomic History  . Marital status: Married    Spouse name: Not on file  . Number of children: Not on file  . Years of education: Not on file  . Highest education level: Not on file  Occupational History  . Not on file  Social Needs  . Financial resource strain: Not on file  . Food insecurity:    Worry: Not on file    Inability: Not on file  . Transportation needs:    Medical: Not on file    Non-medical: Not on file  Tobacco Use  . Smoking status: Never Smoker  . Smokeless tobacco: Never Used  Substance and Sexual Activity  . Alcohol use: Never    Frequency: Never  . Drug use: Never  . Sexual activity: Not on file  Lifestyle  . Physical activity:    Days per week: Not on file    Minutes per session: Not on file  . Stress: Not on file  Relationships  . Social connections:    Talks on phone: Not on file    Gets together: Not on file    Attends religious service: Not on file    Active member of club or organization: Not on file     Attends meetings of clubs or organizations: Not on file    Relationship status: Not on file  Other Topics Concern  . Not on file  Social History Narrative  . Not on file   Outpatient Encounter Medications as of 10/24/2018  Medication Sig  . atorvastatin (LIPITOR) 20 MG tablet TAKE 1 TABLET(20 MG) BY MOUTH DAILY  . levothyroxine (SYNTHROID, LEVOTHROID) 75 MCG tablet Take 1 tablet (75 mcg total) by mouth daily before breakfast.  . loratadine (CLARITIN) 10 MG tablet Take 10 mg by mouth daily.  . metFORMIN (GLUCOPHAGE XR) 500 MG 24 hr tablet Take 1 tablet (500 mg total) by mouth daily with breakfast.  . Multiple Vitamin (MULTIVITAMIN) capsule Take 1 capsule by mouth daily.  Marland Kitchen omeprazole (PRILOSEC) 20 MG capsule Take 1 capsule (20 mg total) by mouth daily.  Marland Kitchen PARoxetine (PAXIL) 20 MG tablet Take 20 mg by mouth daily.  . trandolapril (MAVIK) 4 MG tablet daily as needed.  . Vitamin D, Ergocalciferol, 2000 units CAPS Take by mouth daily.  . [DISCONTINUED] omeprazole (PRILOSEC) 20 MG capsule daily.   No facility-administered encounter medications on  file as of 10/24/2018.     ALLERGIES: Allergies  Allergen Reactions  . Cephalexin Rash    VACCINATION STATUS:  There is no immunization history on file for this patient.  Diabetes  She presents for her follow-up diabetic visit. She has type 2 diabetes mellitus. Onset time: She was recently diagnosed with type 2 diabetes at age 73 years. Her disease course has been stable. There are no hypoglycemic associated symptoms. Pertinent negatives for hypoglycemia include no confusion, headaches, pallor or seizures. There are no diabetic associated symptoms. Pertinent negatives for diabetes include no chest pain, no polydipsia, no polyphagia and no polyuria. There are no hypoglycemic complications. Symptoms are stable. Diabetic complications include nephropathy. Risk factors for coronary artery disease include diabetes mellitus, dyslipidemia, hypertension,  obesity, sedentary lifestyle and post-menopausal. Current diabetic treatment includes oral agent (monotherapy). She is following a generally unhealthy diet. When asked about meal planning, she reported none. She has not had a previous visit with a dietitian. She participates in exercise intermittently. An ACE inhibitor/angiotensin II receptor blocker is being taken.  Hyperlipidemia  This is a chronic problem. The current episode started more than 1 year ago. The problem is uncontrolled. Recent lipid tests were reviewed and are variable. Exacerbating diseases include diabetes, hypothyroidism and obesity. Pertinent negatives include no chest pain, myalgias or shortness of breath. She is currently on no antihyperlipidemic treatment. Risk factors for coronary artery disease include dyslipidemia, diabetes mellitus, hypertension, obesity, post-menopausal, a sedentary lifestyle and family history.  Hypertension  This is a chronic problem. The current episode started more than 1 year ago. Pertinent negatives include no chest pain, headaches, palpitations or shortness of breath. Risk factors for coronary artery disease include diabetes mellitus, dyslipidemia and sedentary lifestyle. Past treatments include ACE inhibitors. Hypertensive end-organ damage includes kidney disease.      Review of Systems  Constitutional: Negative for chills, fever and unexpected weight change.  HENT: Negative for trouble swallowing and voice change.   Eyes: Negative for visual disturbance.  Respiratory: Negative for cough, shortness of breath and wheezing.   Cardiovascular: Negative for chest pain, palpitations and leg swelling.  Gastrointestinal: Negative for diarrhea, nausea and vomiting.  Endocrine: Negative for cold intolerance, heat intolerance, polydipsia, polyphagia and polyuria.  Musculoskeletal: Negative for arthralgias and myalgias.  Skin: Negative for color change, pallor, rash and wound.  Neurological: Negative for  seizures and headaches.  Psychiatric/Behavioral: Negative for confusion and suicidal ideas.    Objective:    There were no vitals taken for this visit.  Wt Readings from Last 3 Encounters:  06/24/18 201 lb 3.2 oz (91.3 kg)  04/20/18 201 lb (91.2 kg)     Physical Exam Constitutional:      Appearance: She is well-developed.  HENT:     Head: Normocephalic and atraumatic.  Neck:     Musculoskeletal: Normal range of motion and neck supple.     Thyroid: No thyromegaly.     Trachea: No tracheal deviation.  Pulmonary:     Effort: Pulmonary effort is normal.  Abdominal:     Tenderness: There is no abdominal tenderness. There is no guarding.  Musculoskeletal: Normal range of motion.  Skin:    General: Skin is warm and dry.     Coloration: Skin is not pale.     Findings: No erythema or rash.  Neurological:     Mental Status: She is alert and oriented to person, place, and time.     Cranial Nerves: No cranial nerve deficit.  Coordination: Coordination normal.     Deep Tendon Reflexes: Reflexes are normal and symmetric.  Psychiatric:        Judgment: Judgment normal.     Recent Results (from the past 2160 hour(s))  TSH + free T4     Status: None   Collection Time: 10/17/18  8:47 AM  Result Value Ref Range   TSH 1.500 0.450 - 4.500 uIU/mL   Free T4 1.60 0.82 - 1.77 ng/dL  Comprehensive metabolic panel     Status: Abnormal   Collection Time: 10/17/18  8:47 AM  Result Value Ref Range   Glucose 145 (H) 65 - 99 mg/dL   BUN 18 8 - 27 mg/dL   Creatinine, Ser 1.61 (H) 0.57 - 1.00 mg/dL   GFR calc non Af Amer 41 (L) >59 mL/min/1.73   GFR calc Af Amer 47 (L) >59 mL/min/1.73   BUN/Creatinine Ratio 14 12 - 28   Sodium 142 134 - 144 mmol/L   Potassium 4.4 3.5 - 5.2 mmol/L   Chloride 102 96 - 106 mmol/L   CO2 24 20 - 29 mmol/L   Calcium 9.2 8.7 - 10.3 mg/dL   Total Protein 7.0 6.0 - 8.5 g/dL   Albumin 4.3 3.7 - 4.7 g/dL   Globulin, Total 2.7 1.5 - 4.5 g/dL   Albumin/Globulin  Ratio 1.6 1.2 - 2.2   Bilirubin Total 0.5 0.0 - 1.2 mg/dL   Alkaline Phosphatase 116 39 - 117 IU/L   AST 19 0 - 40 IU/L   ALT 23 0 - 32 IU/L  Hgb A1c w/o eAG     Status: Abnormal   Collection Time: 10/17/18  8:47 AM  Result Value Ref Range   Hgb A1c MFr Bld 7.2 (H) 4.8 - 5.6 %    Comment:          Prediabetes: 5.7 - 6.4          Diabetes: >6.4          Glycemic control for adults with diabetes: <7.0   Hemoglobin A1c     Status: None   Collection Time: 10/18/18 12:00 AM  Result Value Ref Range   Hemoglobin A1C 7.2      Assessment & Plan:   1. Uncontrolled type 2 diabetes mellitus with Stage 3 renal   - Jenefer Woerner has currently uncontrolled symptomatic type 2 DM since 73 years of age.   -Her previsit labs show A1c of 7.2%, overall improving from 7.5%.     Her recent labs show stage 3 renal insufficiency which is showing improvement.  -her diabetes is complicated by stag e 3 renal insufficiency,obesity/sedentary life and she remains at a high risk for more acute and chronic complications which include CAD, CVA, CKD, retinopathy, and neuropathy. These are all discussed in detail with her.  - I have counseled her on diet management and weight loss, by adopting a carbohydrate restricted/protein rich diet.  - Patient admits there is a room for improvement in her diet and drink choices. -  Suggestion is made for her to avoid simple carbohydrates  from her diet including Cakes, Sweet Desserts / Pastries, Ice Cream, Soda (diet and regular), Sweet Tea, Candies, Chips, Cookies, Store Bought Juices, Alcohol in Excess of  1-2 drinks a day, Artificial Sweeteners, and "Sugar-free" Products. This will help patient to have stable blood glucose profile and potentially avoid unintended weight gain.   - I encouraged her to switch to  unprocessed or minimally processed complex starch and increased protein  intake (animal or plant source), fruits, and vegetables.  - she is advised to stick to a  routine mealtimes to eat 3 meals  a day and avoid unnecessary snacks ( to snack only to correct hypoglycemia).   - I have approached her with the following individualized plan to manage diabetes and patient agrees:   -Her previsit labs show A1c of 7.2%, she will not require insulin treatment at this time.   -She is advised to maintain adequate hydration, avoid over-the-counter NSAIDs to stabilize her kidney function.   -She will continue to benefit from metformin therapy.  I discussed and continued  metformin extended release  500 mg ER once a day after breakfast and supper.    -She  will be considered for GLP-1 receptor agonists if she cannot tolerate metformin and/ or if her A1c continues to increase.  -She is advised to take a break from finger sticking at this time.  - she is not a candidate for SGLT2 inhibitors due to CKD.   2) BP/HTN: she is advised to home monitor blood pressure and report if > 140/90 on 2 separate readings.  she is advised to continue her current medications including trandolapril 4 mg p.o. daily with breakfast .  3) Lipids/HPL:   Review of her recent lipid panel showed uncontrolled  LDL at 138.  she will benefit from statin therapy.  She is tolerating atorvastatin 20 mg p.o. nightly.  She will be considered for fasting lipid panel on subsequent visits.    4) hypothyroidism-long-term diagnosis, took levothyroxine for more than 10 years. -Her thyroid function tests are consistent with appropriate replacement. -Advised to continue levothyroxine  75 mcg p.o. every morning.  - We discussed about the correct intake of her thyroid hormone, on empty stomach at fasting, with water, separated by at least 30 minutes from breakfast and other medications,  and separated by more than 4 hours from calcium, iron, multivitamins, acid reflux medications (PPIs). -Patient is made aware of the fact that thyroid hormone replacement is needed for life, dose to be adjusted by periodic  monitoring of thyroid function tests.   5) Chronic Care/Health Maintenance:  -she  is on ACEI/ARB and Statin medications and  is encouraged to initiate and continue to follow up with Ophthalmology, Dentist,  Podiatrist at least yearly or according to recommendations, and advised to  stay away from smoking. I have recommended yearly flu vaccine and pneumonia vaccine at least every 5 years; moderate intensity exercise for up to 150 minutes weekly; and  sleep for at least 7 hours a day.  - I advised patient to maintain close follow up with Lamont Snowball, MD for primary care needs.  - Time spent with the patient: 25 min, of which >50% was spent in reviewing her  current and  previous labs/studies, previous treatments, and medications doses and developing a plan for long-term care based on the latest recommendations for standards of care. Please refer to " Patient Self Inventory" in the Media  tab for reviewed elements of pertinent patient history. Carol Adams participated in the discussions, expressed understanding, and voiced agreement with the above plans.  All questions were answered to her satisfaction. she is encouraged to contact clinic should she have any questions or concerns prior to her return visit.   Follow up plan: - Return in about 6 months (around 04/25/2019) for Follow up with Pre-visit Labs.  Marquis Lunch, MD [facility] [address] [place], [place] [address]  Phone: [phone]  Fax: [phone]    10/24/2018, 5:14 PM  This note was partially dictated with voice recognition software. Similar sounding words can be transcribed inadequately or may not  be corrected upon review.

## 2018-11-11 ENCOUNTER — Other Ambulatory Visit: Payer: Self-pay | Admitting: "Endocrinology

## 2018-11-11 MED ORDER — LEVOTHYROXINE SODIUM 75 MCG PO TABS: 75.0000 ug | ORAL_TABLET | Freq: Every day | ORAL | 1 refills | Status: DC

## 2019-02-03 ENCOUNTER — Other Ambulatory Visit: Payer: Self-pay | Admitting: "Endocrinology

## 2019-02-16 ENCOUNTER — Other Ambulatory Visit: Payer: Self-pay | Admitting: "Endocrinology

## 2019-03-23 ENCOUNTER — Other Ambulatory Visit: Payer: Self-pay | Admitting: "Endocrinology

## 2019-04-10 ENCOUNTER — Other Ambulatory Visit: Payer: Self-pay | Admitting: "Endocrinology

## 2019-04-10 ENCOUNTER — Telehealth: Payer: Self-pay | Admitting: "Endocrinology

## 2019-04-10 MED ORDER — GLIPIZIDE ER 2.5 MG PO TB24: 2.5000 mg | ORAL_TABLET | Freq: Every day | ORAL | 3 refills | Status: DC

## 2019-04-10 NOTE — Telephone Encounter (Signed)
Pt states her metformin has been recalled. Asking for another RX.

## 2019-04-10 NOTE — Telephone Encounter (Signed)
Pt.notified

## 2019-04-10 NOTE — Telephone Encounter (Signed)
I will send a rx for glipizide 2.5mg  po at breakfast, stay off of metformin until next visit.

## 2019-04-18 ENCOUNTER — Other Ambulatory Visit: Payer: Self-pay | Admitting: "Endocrinology

## 2019-04-19 LAB — COMPREHENSIVE METABOLIC PANEL
ALT: 23 IU/L (ref 0–32)
AST: 23 IU/L (ref 0–40)
Albumin/Globulin Ratio: 1.6 (ref 1.2–2.2)
Albumin: 4.6 g/dL (ref 3.7–4.7)
Alkaline Phosphatase: 118 IU/L — ABNORMAL HIGH (ref 39–117)
BUN/Creatinine Ratio: 13 (ref 12–28)
BUN: 16 mg/dL (ref 8–27)
Bilirubin Total: 0.6 mg/dL (ref 0.0–1.2)
CO2: 24 mmol/L (ref 20–29)
Calcium: 9.6 mg/dL (ref 8.7–10.3)
Chloride: 105 mmol/L (ref 96–106)
Creatinine, Ser: 1.2 mg/dL — ABNORMAL HIGH (ref 0.57–1.00)
GFR calc Af Amer: 51 mL/min/{1.73_m2} — ABNORMAL LOW (ref >59)
GFR calc non Af Amer: 45 mL/min/{1.73_m2} — ABNORMAL LOW (ref >59)
Globulin, Total: 2.9 g/dL (ref 1.5–4.5)
Glucose: 159 mg/dL — ABNORMAL HIGH (ref 65–99)
Potassium: 5.3 mmol/L — ABNORMAL HIGH (ref 3.5–5.2)
Sodium: 143 mmol/L (ref 134–144)
Total Protein: 7.5 g/dL (ref 6.0–8.5)

## 2019-04-19 LAB — T4, FREE: Free T4: 1.63 ng/dL (ref 0.82–1.77)

## 2019-04-19 LAB — TSH: TSH: 1.34 u[IU]/mL (ref 0.450–4.500)

## 2019-04-19 LAB — SPECIMEN STATUS REPORT

## 2019-04-19 LAB — HGB A1C W/O EAG: Hgb A1c MFr Bld: 7.3 % — ABNORMAL HIGH (ref 4.8–5.6)

## 2019-04-25 ENCOUNTER — Other Ambulatory Visit: Payer: Self-pay

## 2019-04-25 ENCOUNTER — Encounter: Payer: Self-pay | Admitting: "Endocrinology

## 2019-04-25 ENCOUNTER — Ambulatory Visit (INDEPENDENT_AMBULATORY_CARE_PROVIDER_SITE_OTHER): Payer: Medicare Other | Admitting: "Endocrinology

## 2019-04-25 DIAGNOSIS — E1121 Type 2 diabetes mellitus with diabetic nephropathy: Secondary | ICD-10-CM | POA: Diagnosis not present

## 2019-04-25 DIAGNOSIS — N1831 Chronic kidney disease, stage 3a: Secondary | ICD-10-CM

## 2019-04-25 DIAGNOSIS — E039 Hypothyroidism, unspecified: Secondary | ICD-10-CM

## 2019-04-25 DIAGNOSIS — E782 Mixed hyperlipidemia: Secondary | ICD-10-CM | POA: Diagnosis not present

## 2019-04-25 DIAGNOSIS — E1122 Type 2 diabetes mellitus with diabetic chronic kidney disease: Secondary | ICD-10-CM

## 2019-04-25 MED ORDER — GLIPIZIDE ER 5 MG PO TB24: 5.0000 mg | ORAL_TABLET | Freq: Every day | ORAL | 1 refills | Status: DC

## 2019-04-25 NOTE — Progress Notes (Signed)
04/25/2019, 11:46 AM                                                            Endocrinology Telehealth Visit Follow up Note -During COVID -19 Pandemic  This visit type was conducted due to national recommendations for restrictions regarding the COVID-19 Pandemic  in an effort to limit this patient's exposure and mitigate transmission of the corona virus.  Due to her co-morbid illnesses, Carol Adams is at  moderate to high risk for complications without adequate follow up.  This format is felt to be most appropriate for her at this time.  I connected with this patient on 04/25/2019   by telephone and verified that I am speaking with the correct person using two identifiers. Carol Adams, August 28, 1945. she has verbally consented to this visit. All issues noted in this document were discussed and addressed. The format was not optimal for physical exam.   Subjective:    Patient ID: Carol Adams, female    DOB: [birth year].  Carol Adams is being engaged in telehealth via telephone for management of currently uncontrolled symptomatic type 2 diabetes, hypothyroidism, hyperlipidemia, hypertension. PMD:   Tommie Sams, MD.   Past Medical History:  Diagnosis Date  . Diabetes mellitus, type II ([place])   . Hypothyroidism    Past Surgical History:  Procedure Laterality Date  . ABDOMINAL HYSTERECTOMY    . CHOLECYSTECTOMY     Social History   Socioeconomic History  . Marital status: Married    Spouse name: Not on file  . Number of children: Not on file  . Years of education: Not on file  . Highest education level: Not on file  Occupational History  . Not on file  Social Needs  . Financial resource strain: Not on file  . Food insecurity    Worry: Not on file    Inability: Not on file  . Transportation needs    Medical: Not on file    Non-medical: Not on file  Tobacco Use  . Smoking status: Never Smoker  . Smokeless tobacco: Never Used  Substance and Sexual Activity   . Alcohol use: Never    Frequency: Never  . Drug use: Never  . Sexual activity: Not on file  Lifestyle  . Physical activity    Days per week: Not on file    Minutes per session: Not on file  . Stress: Not on file  Relationships  . Social Herbalist on phone: Not on file    Gets together: Not on file    Attends religious service: Not on file    Active member of club or organization: Not on file    Attends meetings of clubs or organizations: Not on file    Relationship status: Not on file  Other Topics Concern  . Not on file  Social History Narrative  . Not on file   Outpatient Encounter Medications as of 04/25/2019  Medication Sig  . atorvastatin (LIPITOR) 20 MG tablet TAKE 1 TABLET BY MOUTH  DAILY  . glipiZIDE (GLUCOTROL XL) 5 MG 24 hr tablet Take 1 tablet (5 mg total) by mouth daily with breakfast.  . levothyroxine (SYNTHROID) 75 MCG tablet TAKE 1 TABLET BY MOUTH  DAILY BEFORE BREAKFAST  .  loratadine (CLARITIN) 10 MG tablet Take 10 mg by mouth daily.  . Multiple Vitamin (MULTIVITAMIN) capsule Take 1 capsule by mouth daily.  Marland Kitchen omeprazole (PRILOSEC) 20 MG capsule TAKE 1 CAPSULE BY MOUTH  DAILY  . PARoxetine (PAXIL) 20 MG tablet Take 20 mg by mouth daily.  . trandolapril (MAVIK) 4 MG tablet daily as needed.  . Vitamin D, Ergocalciferol, 2000 units CAPS Take by mouth daily.  . [DISCONTINUED] glipiZIDE (GLUCOTROL XL) 2.5 MG 24 hr tablet Take 1 tablet (2.5 mg total) by mouth daily with breakfast.   No facility-administered encounter medications on file as of 04/25/2019.     ALLERGIES: Allergies  Allergen Reactions  . Cephalexin Rash    VACCINATION STATUS:  There is no immunization history on file for this patient.  Diabetes She presents for her follow-up diabetic visit. She has type 2 diabetes mellitus. Onset time: She was recently diagnosed with type 2 diabetes at age 10 years. Her disease course has been stable. There are no hypoglycemic associated symptoms.  Pertinent negatives for hypoglycemia include no confusion, headaches, pallor or seizures. There are no diabetic associated symptoms. Pertinent negatives for diabetes include no chest pain, no polydipsia, no polyphagia and no polyuria. There are no hypoglycemic complications. Symptoms are stable. Diabetic complications include nephropathy. Risk factors for coronary artery disease include diabetes mellitus, dyslipidemia, hypertension, obesity, sedentary lifestyle and post-menopausal. Current diabetic treatment includes oral agent (monotherapy). Her weight is decreasing steadily. She is following a generally unhealthy diet. When asked about meal planning, she reported none. She has not had a previous visit with a dietitian. She participates in exercise intermittently. Her breakfast blood glucose range is generally 130-140 mg/dl. Her bedtime blood glucose range is generally 140-180 mg/dl. Her overall blood glucose range is 140-180 mg/dl. An ACE inhibitor/angiotensin II receptor blocker is being taken.  Hyperlipidemia This is a chronic problem. The current episode started more than 1 year ago. The problem is uncontrolled. Recent lipid tests were reviewed and are variable. Exacerbating diseases include diabetes, hypothyroidism and obesity. Pertinent negatives include no chest pain, myalgias or shortness of breath. She is currently on no antihyperlipidemic treatment. Risk factors for coronary artery disease include dyslipidemia, diabetes mellitus, hypertension, obesity, post-menopausal, a sedentary lifestyle and family history.  Hypertension This is a chronic problem. The current episode started more than 1 year ago. Pertinent negatives include no chest pain, headaches, palpitations or shortness of breath. Risk factors for coronary artery disease include diabetes mellitus, dyslipidemia and sedentary lifestyle. Past treatments include ACE inhibitors. Hypertensive end-organ damage includes kidney disease.    Review of  systems: Limited as above.  Objective:    There were no vitals taken for this visit.  Wt Readings from Last 3 Encounters:  06/24/18 201 lb 3.2 oz (91.3 kg)  04/20/18 201 lb (91.2 kg)       Recent Results (from the past 2160 hour(s))  Comprehensive metabolic panel     Status: Abnormal   Collection Time: 04/18/19  9:12 AM  Result Value Ref Range   Glucose 159 (H) 65 - 99 mg/dL   BUN 16 8 - 27 mg/dL   Creatinine, Ser 1.61 (H) 0.57 - 1.00 mg/dL   GFR calc non Af Amer 45 (L) >59 mL/min/1.73   GFR calc Af Amer 51 (L) >59 mL/min/1.73   BUN/Creatinine Ratio 13 12 - 28   Sodium 143 134 - 144 mmol/L   Potassium 5.3 (H) 3.5 - 5.2 mmol/L   Chloride 105 96 - 106 mmol/L  CO2 24 20 - 29 mmol/L   Calcium 9.6 8.7 - 10.3 mg/dL   Total Protein 7.5 6.0 - 8.5 g/dL   Albumin 4.6 3.7 - 4.7 g/dL   Globulin, Total 2.9 1.5 - 4.5 g/dL   Albumin/Globulin Ratio 1.6 1.2 - 2.2   Bilirubin Total 0.6 0.0 - 1.2 mg/dL   Alkaline Phosphatase 118 (H) 39 - 117 IU/L   AST 23 0 - 40 IU/L   ALT 23 0 - 32 IU/L  Hgb A1c w/o eAG     Status: Abnormal   Collection Time: 04/18/19  9:12 AM  Result Value Ref Range   Hgb A1c MFr Bld 7.3 (H) 4.8 - 5.6 %    Comment:          Prediabetes: 5.7 - 6.4          Diabetes: >6.4          Glycemic control for adults with diabetes: <7.0   T4, free     Status: None   Collection Time: 04/18/19  9:12 AM  Result Value Ref Range   Free T4 1.63 0.82 - 1.77 ng/dL  TSH     Status: None   Collection Time: 04/18/19  9:12 AM  Result Value Ref Range   TSH 1.340 0.450 - 4.500 uIU/mL  Specimen status report     Status: None   Collection Time: 04/18/19  9:12 AM  Result Value Ref Range   specimen status report Comment     Comment: Carol Adams CMP14 Default Ambig Abbrev CMP14 Default A hand-written panel/profile was received from your office. In accordance with the [facility] Ambiguous Test Code Policy dated July 2003, we have completed your order by using the closest currently or  formerly recognized AMA panel.  We have assigned Comprehensive Metabolic Panel (14), Test Code #322000 to this request.  If this is not the testing you wished to receive on this specimen, please contact the [facility] Client Inquiry/Technical Services Department to clarify the test order.  We appreciate your business.      Assessment & Plan:   1. Uncontrolled type 2 diabetes mellitus with Stage 3 renal   - [place] has currently uncontrolled symptomatic type 2 DM since 73 years of age.   -Her previsit labs show stable A1c of 7.3%.  She was switched from Metformin to glipizide due to a recall concern.  Reports blood glucose ranges from 130-155 at fasting, no hypoglycemia.   Her recent labs show stage 3 renal insufficiency which is showing improvement.  -her diabetes is complicated by stag e 3 renal insufficiency,obesity/sedentary life and she remains at a high risk for more acute and chronic complications which include CAD, CVA, CKD, retinopathy, and neuropathy. These are all discussed in detail with her.  - I have counseled her on diet management and weight loss, by adopting a carbohydrate restricted/protein rich diet.  - she  admits there is a room for improvement in her diet and drink choices. -  Suggestion is made for her to avoid simple carbohydrates  from her diet including Cakes, Sweet Desserts / Pastries, Ice Cream, Soda (diet and regular), Sweet Tea, Candies, Chips, Cookies, Sweet Pastries,  Store Bought Juices, Alcohol in Excess of  1-2 drinks a day, Artificial Sweeteners, Coffee Creamer, and "Sugar-free" Products. This will help patient to have stable blood glucose profile and potentially avoid unintended weight gain.   - I encouraged her to switch to  unprocessed or minimally processed complex starch and increased protein intake (  animal or plant source), fruits, and vegetables.  - she is advised to stick to a routine mealtimes to eat 3 meals  a day and avoid unnecessary snacks (  to snack only to correct hypoglycemia).   - I have approached her with the following individualized plan to manage diabetes and patient agrees:   -Given her previous A1c of 7.3%, she would not need intended metformin.    -She is advised to maintain adequate hydration, avoid over-the-counter NSAIDs to stabilize her kidney function.   -She is advised to stay off of metformin for now.  I discussed and increased her glipizide to 5 mg p.o. daily at breakfast.   -She will monitor at least 1 time a day before breakfast, and at any other time as needed.  -She  will be considered for GLP-1 receptor agonists if she cannot tolerate metformin and/ or if her A1c continues to increase.  -She is advised to take a break from finger sticking at this time.  - she is not a candidate for SGLT2 inhibitors due to CKD.   2) BP/HTN:  she is advised to home monitor blood pressure and report if > 140/90 on 2 separate readings.   she is advised to continue her current medications including trandolapril 4 mg p.o. daily with breakfast .  3) Lipids/HPL:   Review of her recent lipid panel showed uncontrolled  LDL at 138.  she will benefit from statin therapy.  She is tolerating her atorvastatin, advised to continue atorvastatin 20 mg p.o. daily at bedtime.   4) hypothyroidism-long-term diagnosis, took levothyroxine for more than 10 years. -Her thyroid function tests are consistent with appropriate replacement. -She is advised to continue levothyroxine 75 mcg p.o. daily before breakfast.   - We discussed about the correct intake of her thyroid hormone, on empty stomach at fasting, with water, separated by at least 30 minutes from breakfast and other medications,  and separated by more than 4 hours from calcium, iron, multivitamins, acid reflux medications (PPIs). -Patient is made aware of the fact that thyroid hormone replacement is needed for life, dose to be adjusted by periodic monitoring of thyroid function  tests.   5) Chronic Care/Health Maintenance:  -she  is on ACEI/ARB and Statin medications and  is encouraged to initiate and continue to follow up with Ophthalmology, Dentist,  Podiatrist at least yearly or according to recommendations, and advised to  stay away from smoking. I have recommended yearly flu vaccine and pneumonia vaccine at least every 5 years; moderate intensity exercise for up to 150 minutes weekly; and  sleep for at least 7 hours a day.  - I advised patient to maintain close follow up with Lamont Snowball, MD for primary care needs.  - Patient Care Time Today:  25 min, of which >50% was spent in  counseling and the rest reviewing her  current and  previous labs/studies, previous treatments, her blood glucose readings, and medications' doses and developing a plan for long-term care based on the latest recommendations for standards of care.   [name] participated in the discussions, expressed understanding, and voiced agreement with the above plans.  All questions were answered to her satisfaction. she is encouraged to contact clinic should she have any questions or concerns prior to her return visit.   Follow up plan: - Return in about 4 months (around 08/26/2019) for Bring Meter and Logs- A1c in Office.  Marquis Lunch, MD [facility] [address]  [place], [place] [address] Phone: [phone]  Fax: [phone]    04/25/2019, 11:46 AM  This note was partially dictated with voice recognition software. Similar sounding words can be transcribed inadequately or may not  be corrected upon review.

## 2019-08-27 ENCOUNTER — Other Ambulatory Visit: Payer: Self-pay | Admitting: "Endocrinology

## 2019-08-31 ENCOUNTER — Encounter: Payer: Self-pay | Admitting: "Endocrinology

## 2019-08-31 ENCOUNTER — Ambulatory Visit (INDEPENDENT_AMBULATORY_CARE_PROVIDER_SITE_OTHER): Payer: Medicare Other | Admitting: "Endocrinology

## 2019-08-31 ENCOUNTER — Other Ambulatory Visit: Payer: Self-pay

## 2019-08-31 VITALS — BP 131/83 | HR 70 | Ht 64.0 in | Wt 201.2 lb

## 2019-08-31 DIAGNOSIS — E039 Hypothyroidism, unspecified: Secondary | ICD-10-CM

## 2019-08-31 DIAGNOSIS — E1122 Type 2 diabetes mellitus with diabetic chronic kidney disease: Secondary | ICD-10-CM

## 2019-08-31 DIAGNOSIS — E1121 Type 2 diabetes mellitus with diabetic nephropathy: Secondary | ICD-10-CM

## 2019-08-31 DIAGNOSIS — E782 Mixed hyperlipidemia: Secondary | ICD-10-CM

## 2019-08-31 DIAGNOSIS — N1831 Chronic kidney disease, stage 3a: Secondary | ICD-10-CM | POA: Diagnosis not present

## 2019-08-31 LAB — POCT GLYCOSYLATED HEMOGLOBIN (HGB A1C): Hemoglobin A1C: 8.5 % — AB (ref 4.0–5.6)

## 2019-08-31 MED ORDER — METFORMIN HCL ER 500 MG PO TB24: 500.0000 mg | ORAL_TABLET | Freq: Every day | ORAL | 1 refills | Status: DC

## 2019-08-31 MED ORDER — GLIPIZIDE ER 5 MG PO TB24: 5.0000 mg | ORAL_TABLET | Freq: Every day | ORAL | 1 refills | Status: DC

## 2019-08-31 MED ORDER — OMEPRAZOLE 20 MG PO CPDR: 20.0000 mg | DELAYED_RELEASE_CAPSULE | Freq: Every day | ORAL | 1 refills | Status: DC

## 2019-08-31 NOTE — Progress Notes (Signed)
08/31/2019, 4:27 PM          Endocrinology follow-up note   Subjective:    Patient ID: Carol Adams, female    DOB: [birth year].  Carol Adams is being seen in follow-up for management of currently uncontrolled symptomatic type 2 diabetes, hypothyroidism, hyperlipidemia, hypertension. PMD:   Lamont Snowball, MD.   Past Medical History:  Diagnosis Date  . Diabetes mellitus, type II (HCC)   . Hypothyroidism    Past Surgical History:  Procedure Laterality Date  . ABDOMINAL HYSTERECTOMY    . CHOLECYSTECTOMY     Social History   Socioeconomic History  . Marital status: Married    Spouse name: Not on file  . Number of children: Not on file  . Years of education: Not on file  . Highest education level: Not on file  Occupational History  . Not on file  Tobacco Use  . Smoking status: Never Smoker  . Smokeless tobacco: Never Used  Substance and Sexual Activity  . Alcohol use: Never  . Drug use: Never  . Sexual activity: Not on file  Other Topics Concern  . Not on file  Social History Narrative  . Not on file   Social Determinants of Health   Financial Resource Strain:   . Difficulty of Paying Living Expenses: Not on file  Food Insecurity:   . Worried About Programme researcher, broadcasting/film/video in the Last Year: Not on file  . Ran Out of Food in the Last Year: Not on file  Transportation Needs:   . Lack of Transportation (Medical): Not on file  . Lack of Transportation (Non-Medical): Not on file  Physical Activity:   . Days of Exercise per Week: Not on file  . Minutes of Exercise per Session: Not on file  Stress:   . Feeling of Stress : Not on file  Social Connections:   . Frequency of Communication with Friends and Family: Not on file  . Frequency of Social Gatherings with Friends and Family: Not on file  . Attends Religious Services: Not on file  . Active Member of Clubs or Organizations: Not on file  . Attends Banker Meetings: Not on file  .  Marital Status: Not on file   Outpatient Encounter Medications as of 08/31/2019  Medication Sig  . atorvastatin (LIPITOR) 20 MG tablet TAKE 1 TABLET BY MOUTH  DAILY  . glipiZIDE (GLUCOTROL XL) 5 MG 24 hr tablet Take 1 tablet (5 mg total) by mouth daily with breakfast.  . levothyroxine (SYNTHROID) 75 MCG tablet TAKE 1 TABLET BY MOUTH  DAILY BEFORE BREAKFAST  . loratadine (CLARITIN) 10 MG tablet Take 10 mg by mouth daily.  . metFORMIN (GLUCOPHAGE XR) 500 MG 24 hr tablet Take 1 tablet (500 mg total) by mouth daily with breakfast.  . Multiple Vitamin (MULTIVITAMIN) capsule Take 1 capsule by mouth daily.  Marland Kitchen omeprazole (PRILOSEC) 20 MG capsule Take 1 capsule (20 mg total) by mouth daily.  Marland Kitchen PARoxetine (PAXIL) 20 MG tablet Take 20 mg by mouth daily.  . trandolapril (MAVIK) 4 MG tablet daily as needed.  . [DISCONTINUED] glipiZIDE (GLUCOTROL XL) 5 MG 24 hr tablet Take 1 tablet (5 mg total) by mouth daily with breakfast.  . [DISCONTINUED] omeprazole (PRILOSEC) 20 MG capsule TAKE 1 CAPSULE BY MOUTH  DAILY  . [DISCONTINUED] Vitamin D, Ergocalciferol, 2000 units CAPS Take by mouth daily.   No facility-administered encounter medications on file as of 08/31/2019.  ALLERGIES: Allergies  Allergen Reactions  . Cephalexin Rash    VACCINATION STATUS:  There is no immunization history on file for this patient.  Diabetes She presents for her follow-up diabetic visit. She has type 2 diabetes mellitus. Onset time: She was recently diagnosed with type 2 diabetes at age 74 years. Her disease course has been worsening. There are no hypoglycemic associated symptoms. Pertinent negatives for hypoglycemia include no confusion, headaches, pallor or seizures. There are no diabetic associated symptoms. Pertinent negatives for diabetes include no chest pain, no polydipsia, no polyphagia and no polyuria. There are no hypoglycemic complications. Symptoms are worsening. Diabetic complications include nephropathy. Risk  factors for coronary artery disease include diabetes mellitus, dyslipidemia, hypertension, obesity, sedentary lifestyle and post-menopausal. Current diabetic treatment includes oral agent (monotherapy). Her weight is decreasing steadily. She is following a generally unhealthy diet. When asked about meal planning, she reported none. She has not had a previous visit with a dietitian. She participates in exercise intermittently. (Presents with above target glycemic profile both fasting and postprandial.  Her point-of-care A1c is 8.5% increasing from 7.3%.) An ACE inhibitor/angiotensin II receptor blocker is being taken.  Hyperlipidemia This is a chronic problem. The current episode started more than 1 year ago. The problem is uncontrolled. Recent lipid tests were reviewed and are variable. Exacerbating diseases include diabetes, hypothyroidism and obesity. Pertinent negatives include no chest pain, myalgias or shortness of breath. She is currently on no antihyperlipidemic treatment. Risk factors for coronary artery disease include dyslipidemia, diabetes mellitus, hypertension, obesity, post-menopausal, a sedentary lifestyle and family history.  Hypertension This is a chronic problem. The current episode started more than 1 year ago. Pertinent negatives include no chest pain, headaches, palpitations or shortness of breath. Risk factors for coronary artery disease include diabetes mellitus, dyslipidemia and sedentary lifestyle. Past treatments include ACE inhibitors. Hypertensive end-organ damage includes kidney disease.     Review of systems  Constitutional: + Minimally fluctuating body weight,  current  Body mass index is 34.54 kg/m. , no fatigue, no subjective hyperthermia, no subjective hypothermia Eyes: no blurry vision, no xerophthalmia ENT: no sore throat, no nodules palpated in throat, no dysphagia/odynophagia, no hoarseness Cardiovascular: no Chest Pain, no Shortness of Breath, no palpitations, no  leg swelling Respiratory: no cough, no shortness of breath Gastrointestinal: no Nausea/Vomiting/Diarhhea Musculoskeletal: no muscle/joint aches Skin: no rashes, no hyperemia Neurological: no tremors, no numbness, no tingling, no dizziness Psychiatric: no depression, no anxiety   Objective:    BP 131/83   Pulse 70   Ht 5\' 4"  (1.626 m)   Wt 201 lb 3.2 oz (91.3 kg)   BMI 34.54 kg/m   Wt Readings from Last 3 Encounters:  08/31/19 201 lb 3.2 oz (91.3 kg)  06/24/18 201 lb 3.2 oz (91.3 kg)  04/20/18 201 lb (91.2 kg)     Physical Exam- Limited  Constitutional:  Body mass index is 34.54 kg/m. , not in acute distress, normal state of mind Eyes:  EOMI, no exophthalmos Neck: Supple Thyroid: No gross goiter Respiratory: Adequate breathing efforts Musculoskeletal: no gross deformities, strength intact in all four extremities, no gross restriction of joint movements Skin:  no rashes, no hyperemia Neurological: no tremor with outstretched hands,     Recent Results (from the past 2160 hour(s))  HgB A1c     Status: Abnormal   Collection Time: 08/31/19 11:43 AM  Result Value Ref Range   Hemoglobin A1C 8.5 (A) 4.0 - 5.6 %   HbA1c POC (<> result, manual entry)  HbA1c, POC (prediabetic range)     HbA1c, POC (controlled diabetic range)       Assessment & Plan:   1. 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   -Her point-of-care A1c is 25, increasing from 7.3%.  She reports that her fasting blood glucose profile ranges between 175 and 200 mg per DL.     Her recent labs show stage 3 renal insufficiency which is showing improvement.  -her diabetes is complicated by stag e 3 renal insufficiency,obesity/sedentary life and she remains at a high risk for more acute and chronic complications which include CAD, CVA, CKD, retinopathy, and neuropathy. These are all discussed in detail with  her.  - I have counseled her on diet management and weight loss, by adopting a carbohydrate restricted/protein rich diet.  - she  admits there is a room for improvement in her diet and drink choices. -  Suggestion is made for her to avoid simple carbohydrates  from her diet including Cakes, Sweet Desserts / Pastries, Ice Cream, Soda (diet and regular), Sweet Tea, Candies, Chips, Cookies, Sweet Pastries,  Store Bought Juices, Alcohol in Excess of  1-2 drinks a day, Artificial Sweeteners, Coffee Creamer, and "Sugar-free" Products. This will help patient to have stable blood glucose profile and potentially avoid unintended weight gain.   - I encouraged her to switch to  unprocessed or minimally processed complex starch and increased protein intake (animal or plant source), fruits, and vegetables.  - she is advised to stick to a routine mealtimes to eat 3 meals  a day and avoid unnecessary snacks ( to snack only to correct hypoglycemia).   - I have approached her with the following individualized plan to manage diabetes and patient agrees:      -She is advised to maintain adequate hydration, avoid over-the-counter NSAIDs to stabilize her kidney function.   -She will not benefit from reinitiation of low-dose Metformin.  I discussed and initiated Metformin 500 mg ER p.o. daily after breakfast along with her glipizide 5 mg XL p.o. daily with breakfast.  -She  will be considered for GLP-1 receptor agonists if she cannot tolerate metformin and/ or if her A1c continues to increase.  -She is approached to start monitoring blood glucose at least once a day before breakfast. -She is encouraged to call clinic glucose readings less than 70 or greater than 200 mg per DL. - she is not a candidate for SGLT2 inhibitors due to CKD.   2) BP/HTN:  she is advised to home monitor blood pressure and report if > 140/90 on 2 separate readings.   she is advised to continue her current medications including  trandolapril 4 mg p.o. daily with breakfast .  3) Lipids/HPL:   Review of her recent lipid panel showed uncontrolled  LDL at 138.  she will benefit from statin therapy.  She is tolerating her atorvastatin, advised to continue atorvastatin 20 mg p.o. daily at bedtime.   4) hypothyroidism-long-term diagnosis, took levothyroxine for more than 10 years. -Her thyroid function tests are consistent with appropriate replacement. She is advised to continue levothyroxine 75 mcg p.o. daily before breakfast.  - We discussed about the correct intake of her thyroid hormone, on empty stomach at fasting, with water, separated by at least 30 minutes from breakfast and other medications,  and separated by more than 4 hours from calcium, iron, multivitamins, acid reflux medications (  PPIs). -Patient is made aware of the fact that thyroid hormone replacement is needed for life, dose to be adjusted by periodic monitoring of thyroid function tests.   5) Chronic Care/Health Maintenance:  -she  is on ACEI/ARB and Statin medications and  is encouraged to initiate and continue to follow up with Ophthalmology, Dentist,  Podiatrist at least yearly or according to recommendations, and advised to  stay away from smoking. I have recommended yearly flu vaccine and pneumonia vaccine at least every 5 years; moderate intensity exercise for up to 150 minutes weekly; and  sleep for at least 7 hours a day.  - I advised patient to maintain close follow up with Lamont Snowball, MD for primary care needs.  - Time spent on this patient care encounter:  35 min, of which > 50% was spent in  counseling and the rest reviewing her blood glucose logs , discussing her hypoglycemia and hyperglycemia episodes, reviewing her current and  previous labs / studies  ( including abstraction from other facilities) and medications  doses and developing a  long term treatment plan and documenting her care.   Please refer to Patient Instructions for Blood  Glucose Monitoring and Insulin/Medications Dosing Guide"  in media tab for additional information. Please  also refer to " Patient Self Inventory" in the Media  tab for reviewed elements of pertinent patient history.  Carol Adams participated in the discussions, expressed understanding, and voiced agreement with the above plans.  All questions were answered to her satisfaction. she is encouraged to contact clinic should she have any questions or concerns prior to her return visit.  Follow up plan: - Return in about 4 months (around 12/31/2019) for Bring Meter and Logs- A1c in Office, Follow up with Pre-visit Labs.  Marquis Lunch, MD [facility] [address] [place], [place] [address] Phone: [phone]  Fax: [phone]    08/31/2019, 4:27 PM  This note was partially dictated with voice recognition software. Similar sounding words can be transcribed inadequately or may not  be corrected upon review.

## 2019-08-31 NOTE — Patient Instructions (Signed)

## 2019-11-11 ENCOUNTER — Other Ambulatory Visit: Payer: Self-pay | Admitting: "Endocrinology

## 2019-12-04 LAB — HEMOGLOBIN A1C: Hemoglobin A1C: 7.7

## 2019-12-30 ENCOUNTER — Other Ambulatory Visit: Payer: Self-pay | Admitting: "Endocrinology

## 2020-01-05 ENCOUNTER — Ambulatory Visit: Payer: Medicare Other | Admitting: "Endocrinology

## 2020-01-16 ENCOUNTER — Other Ambulatory Visit: Payer: Self-pay | Admitting: "Endocrinology

## 2020-01-16 LAB — BASIC METABOLIC PANEL
BUN: 15 (ref 4–21)
CO2: 24 — AB (ref 13–22)
Chloride: 105 (ref 99–108)
Creatinine: 1.1 (ref 0.5–1.1)
Glucose: 142
Potassium: 4.9 (ref 3.4–5.3)
Sodium: 141 (ref 137–147)

## 2020-01-16 LAB — LIPID PANEL
Cholesterol: 140 (ref 0–200)
HDL: 53 (ref 35–70)
LDL Cholesterol: 67
Triglycerides: 108 (ref 40–160)

## 2020-01-16 LAB — HEPATIC FUNCTION PANEL
ALT: 21 (ref 7–35)
AST: 22 (ref 13–35)
Alkaline Phosphatase: 112 (ref 25–125)
Bilirubin, Total: 0.5

## 2020-01-16 LAB — CBC AND DIFFERENTIAL
HCT: 36 (ref 36–46)
Hemoglobin: 11.7 — AB (ref 12.0–16.0)
Neutrophils Absolute: 3
Platelets: 239 (ref 150–399)
WBC: 5.4

## 2020-01-16 LAB — VITAMIN D 25 HYDROXY (VIT D DEFICIENCY, FRACTURES): Vit D, 25-Hydroxy: 51.8

## 2020-01-16 LAB — COMPREHENSIVE METABOLIC PANEL
Albumin: 4.5 (ref 3.5–5.0)
Calcium: 8.9 (ref 8.7–10.7)
Globulin: 2.5

## 2020-01-16 LAB — TSH: TSH: 1.84 (ref 0.41–5.90)

## 2020-01-16 LAB — CBC: RBC: 4.3 (ref 3.87–5.11)

## 2020-01-17 LAB — CBC/DIFF AMBIGUOUS DEFAULT
Basophils Absolute: 0.1 10*3/uL (ref 0.0–0.2)
Basos: 1 %
EOS (ABSOLUTE): 0.4 10*3/uL (ref 0.0–0.4)
Eos: 7 %
Hematocrit: 36.3 % (ref 34.0–46.6)
Hemoglobin: 11.7 g/dL (ref 11.1–15.9)
Immature Grans (Abs): 0 10*3/uL (ref 0.0–0.1)
Immature Granulocytes: 0 %
Lymphocytes Absolute: 1.6 10*3/uL (ref 0.7–3.1)
Lymphs: 29 %
MCH: 27.2 pg (ref 26.6–33.0)
MCHC: 32.2 g/dL (ref 31.5–35.7)
MCV: 84 fL (ref 79–97)
Monocytes Absolute: 0.5 10*3/uL (ref 0.1–0.9)
Monocytes: 9 %
Neutrophils Absolute: 3 10*3/uL (ref 1.4–7.0)
Neutrophils: 54 %
Platelets: 239 10*3/uL (ref 150–450)
RBC: 4.3 x10E6/uL (ref 3.77–5.28)
RDW: 13.7 % (ref 11.7–15.4)
WBC: 5.4 10*3/uL (ref 3.4–10.8)

## 2020-01-17 LAB — COMPREHENSIVE METABOLIC PANEL
ALT: 21 IU/L (ref 0–32)
AST: 22 IU/L (ref 0–40)
Albumin/Globulin Ratio: 1.8 (ref 1.2–2.2)
Albumin: 4.5 g/dL (ref 3.7–4.7)
Alkaline Phosphatase: 112 IU/L (ref 48–121)
BUN/Creatinine Ratio: 13 (ref 12–28)
BUN: 15 mg/dL (ref 8–27)
Bilirubin Total: 0.5 mg/dL (ref 0.0–1.2)
CO2: 24 mmol/L (ref 20–29)
Calcium: 8.9 mg/dL (ref 8.7–10.3)
Chloride: 105 mmol/L (ref 96–106)
Creatinine, Ser: 1.13 mg/dL — ABNORMAL HIGH (ref 0.57–1.00)
GFR calc Af Amer: 55 mL/min/{1.73_m2} — ABNORMAL LOW (ref >59)
GFR calc non Af Amer: 48 mL/min/{1.73_m2} — ABNORMAL LOW (ref >59)
Globulin, Total: 2.5 g/dL (ref 1.5–4.5)
Glucose: 142 mg/dL — ABNORMAL HIGH (ref 65–99)
Potassium: 4.9 mmol/L (ref 3.5–5.2)
Sodium: 141 mmol/L (ref 134–144)
Total Protein: 7 g/dL (ref 6.0–8.5)

## 2020-01-17 LAB — LIPID PANEL W/O CHOL/HDL RATIO
Cholesterol, Total: 140 mg/dL (ref 100–199)
HDL: 53 mg/dL (ref >39)
LDL Chol Calc (NIH): 67 mg/dL (ref 0–99)
Triglycerides: 108 mg/dL (ref 0–149)
VLDL Cholesterol Cal: 20 mg/dL (ref 5–40)

## 2020-01-17 LAB — T4, FREE: Free T4: 1.36 ng/dL (ref 0.82–1.77)

## 2020-01-17 LAB — MICROALBUMIN / CREATININE URINE RATIO
Creatinine, Urine: 181 mg/dL
Microalb/Creat Ratio: 272 mg/g creat — ABNORMAL HIGH (ref 0–29)
Microalbumin, Urine: 492.2 ug/mL

## 2020-01-17 LAB — SPECIMEN STATUS REPORT

## 2020-01-17 LAB — TSH: TSH: 1.84 u[IU]/mL (ref 0.450–4.500)

## 2020-01-17 LAB — VITAMIN D 25 HYDROXY (VIT D DEFICIENCY, FRACTURES): Vit D, 25-Hydroxy: 51.8 ng/mL (ref 30.0–100.0)

## 2020-01-18 ENCOUNTER — Ambulatory Visit: Payer: Medicare Other | Admitting: "Endocrinology

## 2020-01-19 ENCOUNTER — Other Ambulatory Visit: Payer: Self-pay

## 2020-01-19 ENCOUNTER — Encounter: Payer: Self-pay | Admitting: Nurse Practitioner

## 2020-01-19 ENCOUNTER — Ambulatory Visit (INDEPENDENT_AMBULATORY_CARE_PROVIDER_SITE_OTHER): Payer: Medicare Other | Admitting: Nurse Practitioner

## 2020-01-19 VITALS — BP 136/77 | HR 69 | Ht 64.0 in | Wt 200.4 lb

## 2020-01-19 DIAGNOSIS — E1121 Type 2 diabetes mellitus with diabetic nephropathy: Secondary | ICD-10-CM | POA: Diagnosis not present

## 2020-01-19 DIAGNOSIS — E559 Vitamin D deficiency, unspecified: Secondary | ICD-10-CM | POA: Diagnosis not present

## 2020-01-19 DIAGNOSIS — I10 Essential (primary) hypertension: Secondary | ICD-10-CM

## 2020-01-19 DIAGNOSIS — N1831 Chronic kidney disease, stage 3a: Secondary | ICD-10-CM

## 2020-01-19 DIAGNOSIS — E782 Mixed hyperlipidemia: Secondary | ICD-10-CM

## 2020-01-19 MED ORDER — LANCETS MISC: 1.0000 | 3 refills | Status: DC

## 2020-01-19 MED ORDER — ACCU-CHEK GUIDE VI STRP: ORAL_STRIP | 2 refills | Status: DC

## 2020-01-19 NOTE — Progress Notes (Signed)
01/19/2020, 12:43 PM          Endocrinology follow-up note   Subjective:    Patient ID: Carol Adams, female    DOB: December 07, 1945.  Carol Adams is being seen in follow-up for management of currently uncontrolled symptomatic type 2 diabetes, hypothyroidism, hyperlipidemia, hypertension.  She is accompanied by her husband today.  PMD:   Lamont Snowball, MD.   Past Medical History:  Diagnosis Date  . Diabetes mellitus, type II (HCC)   . Hypothyroidism    Past Surgical History:  Procedure Laterality Date  . ABDOMINAL HYSTERECTOMY    . CHOLECYSTECTOMY     Social History   Socioeconomic History  . Marital status: Married    Spouse name: Not on file  . Number of children: Not on file  . Years of education: Not on file  . Highest education level: Not on file  Occupational History  . Not on file  Tobacco Use  . Smoking status: Never Smoker  . Smokeless tobacco: Never Used  Vaping Use  . Vaping Use: Never used  Substance and Sexual Activity  . Alcohol use: Never  . Drug use: Never  . Sexual activity: Not on file  Other Topics Concern  . Not on file  Social History Narrative  . Not on file   Social Determinants of Health   Financial Resource Strain:   . Difficulty of Paying Living Expenses:   Food Insecurity:   . Worried About Programme researcher, broadcasting/film/video in the Last Year:   . Barista in the Last Year:   Transportation Needs:   . Freight forwarder (Medical):   Marland Kitchen Lack of Transportation (Non-Medical):   Physical Activity:   . Days of Exercise per Week:   . Minutes of Exercise per Session:   Stress:   . Feeling of Stress :   Social Connections:   . Frequency of Communication with Friends and Family:   . Frequency of Social Gatherings with Friends and Family:   . Attends Religious Services:   . Active Member of Clubs or Organizations:   . Attends Banker Meetings:   Marland Kitchen Marital Status:    Outpatient Encounter Medications as  of 01/19/2020  Medication Sig  . atorvastatin (LIPITOR) 20 MG tablet TAKE 1 TABLET BY MOUTH  DAILY  . glipiZIDE (GLUCOTROL XL) 5 MG 24 hr tablet Take 1 tablet (5 mg total) by mouth daily with breakfast.  . glucose blood (ACCU-CHEK GUIDE) test strip Use as instructed  . Lancets MISC 1 each by Does not apply route as directed.  Marland Kitchen levothyroxine (SYNTHROID) 75 MCG tablet TAKE 1 TABLET BY MOUTH  DAILY BEFORE BREAKFAST  . loratadine (CLARITIN) 10 MG tablet Take 10 mg by mouth daily.  . metFORMIN (GLUCOPHAGE-XR) 500 MG 24 hr tablet TAKE 1 TABLET BY MOUTH  DAILY WITH BREAKFAST  . Multiple Vitamin (MULTIVITAMIN) capsule Take 1 capsule by mouth daily.  Marland Kitchen omeprazole (PRILOSEC) 20 MG capsule Take 1 capsule (20 mg total) by mouth daily.  Marland Kitchen PARoxetine (PAXIL) 20 MG tablet Take 20 mg by mouth daily.  . trandolapril (MAVIK) 4 MG tablet daily as needed.   No facility-administered encounter medications on file as of 01/19/2020.    ALLERGIES: Allergies  Allergen Reactions  . Cephalexin Rash    VACCINATION STATUS:  There is no immunization history on file for this patient.  Diabetes She presents for her follow-up diabetic visit. She has type 2  diabetes mellitus. The initial diagnosis of diabetes was made 2 years (She was recently diagnosed with type 2 diabetes at age 65 years.) ago. Her disease course has been improving. There are no hypoglycemic associated symptoms. Pertinent negatives for hypoglycemia include no confusion, headaches, pallor or seizures. There are no diabetic associated symptoms. Pertinent negatives for diabetes include no chest pain, no polydipsia, no polyphagia and no polyuria. There are no hypoglycemic complications. Symptoms are improving. Risk factors for coronary artery disease include diabetes mellitus, dyslipidemia, hypertension, obesity, sedentary lifestyle and post-menopausal. Current diabetic treatment includes oral agent (monotherapy). She is compliant with treatment all of the  time. Her weight is fluctuating minimally. She is following a diabetic diet. When asked about meal planning, she reported none. She has not had a previous visit with a dietitian. She participates in exercise intermittently. Her home blood glucose trend is decreasing steadily. Her breakfast blood glucose range is generally 110-130 mg/dl. (She presents to the clinic today with slightly above target fasting glycemic profile.  Her point-of-care A1c done at her oncologist on December 04, 2019 was 7.7%, improving from her last A1c of 8.5 in March 2021.  ) An ACE inhibitor/angiotensin II receptor blocker is being taken. Eye exam is current.  Hyperlipidemia This is a chronic problem. The current episode started more than 1 year ago. The problem is uncontrolled. Recent lipid tests were reviewed and are variable. Exacerbating diseases include diabetes, hypothyroidism and obesity. Pertinent negatives include no chest pain, myalgias or shortness of breath. She is currently on no antihyperlipidemic treatment. Risk factors for coronary artery disease include dyslipidemia, diabetes mellitus, hypertension, obesity, post-menopausal, a sedentary lifestyle and family history.  Hypertension This is a chronic problem. The current episode started more than 1 year ago. Pertinent negatives include no chest pain, headaches, palpitations or shortness of breath. Risk factors for coronary artery disease include diabetes mellitus, dyslipidemia and sedentary lifestyle. Past treatments include ACE inhibitors. Hypertensive end-organ damage includes kidney disease.     Review of systems  Constitutional: + Minimally fluctuating body weight,  current  Body mass index is 34.4 kg/m. ,  Mild fatigue, no subjective hyperthermia, no subjective hypothermia Eyes: no blurry vision, no xerophthalmia ENT: no sore throat, no nodules palpated in throat, no dysphagia/odynophagia, no hoarseness Cardiovascular: no Chest Pain, no Shortness of Breath, no  palpitations, no leg swelling Respiratory: no cough, no shortness of breath Gastrointestinal: no Nausea/Vomiting/Diarhhea Musculoskeletal: no muscle/joint aches Skin: no rashes, no hyperemia Neurological: no tremors, no numbness, no tingling, no dizziness Psychiatric: no depression, no anxiety   Objective:    BP (!) 136/77   Pulse 69   Ht 5\' 4"  (1.626 m)   Wt 200 lb 6.4 oz (90.9 kg)   BMI 34.40 kg/m   Wt Readings from Last 3 Encounters:  01/19/20 200 lb 6.4 oz (90.9 kg)  08/31/19 201 lb 3.2 oz (91.3 kg)  06/24/18 201 lb 3.2 oz (91.3 kg)      Physical Exam- Limited  Constitutional:  Body mass index is 34.4 kg/m. , not in acute distress, normal state of mind Eyes:  EOMI, no exophthalmos Neck: Supple Cardiovascular: RRR, no murmers, rubs, or gallops  Respiratory: Adequate breathing efforts, no crackles, rales, ronchi Musculoskeletal: no gross deformities, strength intact in all four extremities, no gross restriction of joint movements Skin:  no rashes, no hyperemia Neurological: no tremor with outstretched hands,   Foot exam:   No rashes, ulcers, cuts, calluses, onychodystrophy.   Good pulses bilat.  Good sensation to 10  g monofilament bilat.     Recent Results (from the past 2160 hour(s))  Hemoglobin A1c     Status: None   Collection Time: 12/04/19 12:00 AM  Result Value Ref Range   Hemoglobin A1C 7.7   CBC and differential     Status: Abnormal   Collection Time: 01/16/20 12:00 AM  Result Value Ref Range   Hemoglobin 11.7 (A) 12.0 - 16.0   HCT 36 36 - 46   Neutrophils Absolute 3    Platelets 239 150 - 399   WBC 5.4   CBC     Status: None   Collection Time: 01/16/20 12:00 AM  Result Value Ref Range   RBC 4.3 3.87 - 5.11  VITAMIN D 25 Hydroxy (Vit-D Deficiency, Fractures)     Status: None   Collection Time: 01/16/20 12:00 AM  Result Value Ref Range   Vit D, 25-Hydroxy 51.8   Basic metabolic panel     Status: Abnormal   Collection Time: 01/16/20 12:00  AM  Result Value Ref Range   Glucose 142    BUN 15 4 - 21   CO2 24 (A) 13 - 22   Creatinine 1.1 0.5 - 1.1   Potassium 4.9 3.4 - 5.3   Sodium 141 137 - 147   Chloride 105 99 - 108  Comprehensive metabolic panel     Status: None   Collection Time: 01/16/20 12:00 AM  Result Value Ref Range   Globulin 2.5    Calcium 8.9 8.7 - 10.7   Albumin 4.5 3.5 - 5.0  Lipid panel     Status: None   Collection Time: 01/16/20 12:00 AM  Result Value Ref Range   Triglycerides 108 40 - 160   Cholesterol 140 0 - 200   HDL 53 35 - 70   LDL Cholesterol 67   Hepatic function panel     Status: None   Collection Time: 01/16/20 12:00 AM  Result Value Ref Range   Alkaline Phosphatase 112 25 - 125   ALT 21 7 - 35   AST 22 13 - 35   Bilirubin, Total 0.5   TSH     Status: None   Collection Time: 01/16/20 12:00 AM  Result Value Ref Range   TSH 1.84 0.41 - 5.90    Comment: T4, Free 1.36     Assessment & Plan:   1. 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    She presents to the clinic today with slightly above target fasting glycemic profile.  Her point-of-care A1c done at her oncologist on December 04, 2019 was 7.7%, improving from her last A1c of 8.5 in March 2021.   Her recent labs on 01/16/2020 reviewed and shows improvement in kidney function.  -her diabetes is complicated by stage 3 renal insufficiency,obesity/sedentary life and she remains at a high risk for more acute and chronic complications which include CAD, CVA, CKD, retinopathy, and neuropathy. These are all discussed in detail with her.  - I have counseled her on diet management and weight loss, by adopting a carbohydrate restricted/protein rich diet.  - she  admits there is a room for improvement in her diet and drink choices. -  Suggestion is made for her to avoid simple carbohydrates  from her diet including Cakes, Sweet Desserts /  Pastries, Ice Cream, Soda (diet and regular), Sweet Tea, Candies, Chips, Cookies, Sweet  Pastries,  Store Bought Juices, Alcohol in Excess of  1-2 drinks a day, Artificial Sweeteners, Coffee Creamer, and "Sugar-free" Products. This will help patient to have stable blood glucose profile and potentially avoid unintended weight gain.   - I encouraged her to switch to  unprocessed or minimally processed complex starch and increased protein intake (animal or plant source), fruits, and vegetables.  - she is advised to stick to a routine mealtimes to eat 3 meals  a day and avoid unnecessary snacks ( to snack only to correct hypoglycemia).   - I have approached her with the following individualized plan to manage diabetes and patient agrees:      -She is advised to maintain adequate hydration, avoid over-the-counter NSAIDs to stabilize her kidney function.   -She is benefiting from reinitiation of low-dose Metformin.  I advised her to continue with her Metformin 500 mg daily with breakfast as well as continuing her glipizide 5 mg p.o. daily with breakfast .   -She is advised to continue monitoring blood sugar at least once a day, and as needed if symptoms arise. -She is encouraged to call clinic glucose readings less than 70 or greater than 200 mg per DL. - she is not a candidate for SGLT2 inhibitors due to CKD.   2) BP/HTN: Her blood pressure is currently controlled to target.  She is not on any routine antihypertensive treatment at this time.  She does have trandolapril 4 mg p.o. daily as needed. she is advised to home monitor blood pressure and report if > 140/90 on 2 separate readings.   3) Lipids/HPL: Her recent lipid panel shows improving LDL of 67.  She is benefiting from and tolerating statin therapy.  She is advised to continue taking her atorvastatin 20 mg p.o. daily at bedtime.    4) hypothyroidism-long-term diagnosis, took levothyroxine for more than 10 years. -Her recent thyroid studies  indicate appropriate replacement.  Advised her to continue her levothyroxine 75 mcg daily before breakfast on an empty stomach.    - We discussed about the correct intake of her thyroid hormone, on empty stomach at fasting, with water, separated by at least 30 minutes from breakfast and other medications,  and separated by more than 4 hours from calcium, iron, multivitamins, acid reflux medications (PPIs). -Patient is made aware of the fact that thyroid hormone replacement is needed for life, dose to be adjusted by periodic monitoring of thyroid function tests.   5) Chronic Care/Health Maintenance:  -she  is on ACEI/ARB and Statin medications and  is encouraged to initiate and continue to follow up with Ophthalmology, Dentist,  Podiatrist at least yearly or according to recommendations, and advised to  stay away from smoking. I have recommended yearly flu vaccine and pneumonia vaccine at least every 5 years; moderate intensity exercise for up to 150 minutes weekly; and  sleep for at least 7 hours a day.  - I advised patient to maintain close follow up with Lamont Snowball, MD for primary care needs.  - Time spent on this patient care encounter:  35 min, of which > 50% was spent in  counseling and the rest reviewing her blood glucose logs , discussing her hypoglycemia and hyperglycemia episodes, reviewing her current and  previous labs / studies  ( including abstraction from other facilities) and medications  doses and developing a  long term treatment plan and documenting her care.   Please refer to Patient Instructions for Blood Glucose Monitoring and Insulin/Medications Dosing Guide"  in media tab for additional information. Please  also refer to " Patient Self Inventory" in the Media  tab for reviewed elements of pertinent patient history.  Carol Adams and her husband participated in the discussions, expressed understanding, and voiced agreement with the above plans.  All questions were answered to  her satisfaction. she is encouraged to contact clinic should she have any questions or concerns prior to her return visit. - Follow up plan: - Return in about 4 months (around 05/21/2020) for diabetes follow up: A1c in office (no labs prior to visit)- bring meter and logs!Ronny Bacon, FNP-BC [facility] Phone: [phone] Fax: [phone]   01/19/2020, 12:43 PM  This note was partially dictated with voice recognition software. Similar sounding words can be transcribed inadequately or may not  be corrected upon review.

## 2020-01-20 ENCOUNTER — Encounter: Payer: Self-pay | Admitting: Nurse Practitioner

## 2020-01-22 ENCOUNTER — Telehealth: Payer: Self-pay | Admitting: "Endocrinology

## 2020-01-22 ENCOUNTER — Other Ambulatory Visit: Payer: Self-pay | Admitting: Nurse Practitioner

## 2020-01-22 DIAGNOSIS — E1122 Type 2 diabetes mellitus with diabetic chronic kidney disease: Secondary | ICD-10-CM

## 2020-01-22 MED ORDER — ACCU-CHEK GUIDE VI STRP: ORAL_STRIP | 2 refills | Status: DC

## 2020-01-22 NOTE — Telephone Encounter (Signed)
Rx sent 

## 2020-01-22 NOTE — Telephone Encounter (Signed)
Patient said that the pharmacy needs instructions on her test strips

## 2020-05-04 ENCOUNTER — Other Ambulatory Visit: Payer: Self-pay | Admitting: "Endocrinology

## 2020-05-07 ENCOUNTER — Other Ambulatory Visit: Payer: Self-pay | Admitting: "Endocrinology

## 2020-05-11 ENCOUNTER — Other Ambulatory Visit: Payer: Self-pay | Admitting: "Endocrinology

## 2020-05-27 ENCOUNTER — Encounter: Payer: Self-pay | Admitting: Nurse Practitioner

## 2020-05-27 ENCOUNTER — Ambulatory Visit (INDEPENDENT_AMBULATORY_CARE_PROVIDER_SITE_OTHER): Payer: Medicare Other | Admitting: Nurse Practitioner

## 2020-05-27 ENCOUNTER — Other Ambulatory Visit: Payer: Self-pay

## 2020-05-27 VITALS — BP 179/87 | HR 76

## 2020-05-27 DIAGNOSIS — E559 Vitamin D deficiency, unspecified: Secondary | ICD-10-CM

## 2020-05-27 DIAGNOSIS — E1122 Type 2 diabetes mellitus with diabetic chronic kidney disease: Secondary | ICD-10-CM

## 2020-05-27 DIAGNOSIS — E782 Mixed hyperlipidemia: Secondary | ICD-10-CM | POA: Diagnosis not present

## 2020-05-27 DIAGNOSIS — I10 Essential (primary) hypertension: Secondary | ICD-10-CM | POA: Diagnosis not present

## 2020-05-27 DIAGNOSIS — E039 Hypothyroidism, unspecified: Secondary | ICD-10-CM

## 2020-05-27 DIAGNOSIS — N1831 Chronic kidney disease, stage 3a: Secondary | ICD-10-CM

## 2020-05-27 LAB — POCT GLYCOSYLATED HEMOGLOBIN (HGB A1C): HbA1c, POC (controlled diabetic range): 7.6 % — AB (ref 0.0–7.0)

## 2020-05-27 NOTE — Progress Notes (Signed)
05/27/2020, 11:05 AM          Endocrinology follow-up note   Subjective:    Patient ID: Carol Adams, female    DOB: 1946-04-10.  Karenna Romanoff is being seen in follow-up for management of currently uncontrolled symptomatic type 2 diabetes, hypothyroidism, hyperlipidemia, hypertension.  She is accompanied by her husband today.  PMD:   Lamont Snowball, MD.   Past Medical History:  Diagnosis Date   Diabetes mellitus, type II (HCC)    Hypothyroidism    Past Surgical History:  Procedure Laterality Date   ABDOMINAL HYSTERECTOMY     CHOLECYSTECTOMY     Social History   Socioeconomic History   Marital status: Married    Spouse name: Not on file   Number of children: Not on file   Years of education: Not on file   Highest education level: Not on file  Occupational History   Not on file  Tobacco Use   Smoking status: Never Smoker   Smokeless tobacco: Never Used  Vaping Use   Vaping Use: Never used  Substance and Sexual Activity   Alcohol use: Never   Drug use: Never   Sexual activity: Not on file  Other Topics Concern   Not on file  Social History Narrative   Not on file   Social Determinants of Health   Financial Resource Strain:    Difficulty of Paying Living Expenses: Not on file  Food Insecurity:    Worried About Running Out of Food in the Last Year: Not on file   Ran Out of Food in the Last Year: Not on file  Transportation Needs:    Lack of Transportation (Medical): Not on file   Lack of Transportation (Non-Medical): Not on file  Physical Activity:    Days of Exercise per Week: Not on file   Minutes of Exercise per Session: Not on file  Stress:    Feeling of Stress : Not on file  Social Connections:    Frequency of Communication with Friends and Family: Not on file   Frequency of Social Gatherings with Friends and Family: Not on file   Attends Religious Services: Not on file   Active Member of Clubs or  Organizations: Not on file   Attends Club or Organization Meetings: Not on file   Marital Status: Not on file   Outpatient Encounter Medications as of 05/27/2020  Medication Sig   atorvastatin (LIPITOR) 20 MG tablet TAKE 1 TABLET BY MOUTH  DAILY   glipiZIDE (GLUCOTROL XL) 5 MG 24 hr tablet TAKE 1 TABLET BY MOUTH  DAILY WITH BREAKFAST   glucose blood (ACCU-CHEK GUIDE) test strip Test BG 1-2 times daily. E11.65   Lancets MISC 1 each by Does not apply route as directed.   levothyroxine (SYNTHROID) 75 MCG tablet TAKE 1 TABLET BY MOUTH  DAILY BEFORE BREAKFAST   loratadine (CLARITIN) 10 MG tablet Take 10 mg by mouth daily.   metFORMIN (GLUCOPHAGE-XR) 500 MG 24 hr tablet TAKE 1 TABLET BY MOUTH  DAILY WITH BREAKFAST   Multiple Vitamin (MULTIVITAMIN) capsule Take 1 capsule by mouth daily.   omeprazole (PRILOSEC) 20 MG capsule TAKE 1 CAPSULE BY MOUTH  DAILY   PARoxetine (PAXIL) 20 MG tablet Take 20 mg by mouth daily.   trandolapril (MAVIK) 4 MG tablet daily as needed.   No facility-administered encounter medications on file as of 05/27/2020.    ALLERGIES: Allergies  Allergen Reactions   Cephalexin Rash  VACCINATION STATUS:  There is no immunization history on file for this patient.  Diabetes She presents for her follow-up diabetic visit. She has type 2 diabetes mellitus. The initial diagnosis of diabetes was made 2 years (She was recently diagnosed with type 2 diabetes at age 22 years.) ago. Her disease course has been stable. There are no hypoglycemic associated symptoms. Pertinent negatives for hypoglycemia include no confusion, headaches, pallor or seizures. There are no diabetic associated symptoms. Pertinent negatives for diabetes include no chest pain, no polydipsia, no polyphagia and no polyuria. There are no hypoglycemic complications. Symptoms are stable. Diabetic complications include nephropathy. Risk factors for coronary artery disease include diabetes mellitus,  dyslipidemia, hypertension, obesity, sedentary lifestyle and post-menopausal. Current diabetic treatment includes oral agent (dual therapy). She is compliant with treatment all of the time. Her weight is fluctuating minimally. She is following a diabetic diet. When asked about meal planning, she reported none. She has not had a previous visit with a dietitian. She participates in exercise intermittently. Her home blood glucose trend is fluctuating minimally. Her breakfast blood glucose range is generally 130-140 mg/dl. (She presents today with her meter and logs showing slightly above target fasting and postprandial glycemic profile.  Her POCT A1c today is 7.6%, essentially unchanged from previous visit of 7.7%.  She denies any episodes of hypoglycemia. ) An ACE inhibitor/angiotensin II receptor blocker is not being taken. She does not see a podiatrist.Eye exam is current.  Hyperlipidemia This is a chronic problem. The current episode started more than 1 year ago. The problem is controlled. Recent lipid tests were reviewed and are normal. Exacerbating diseases include chronic renal disease, diabetes, hypothyroidism and obesity. There are no known factors aggravating her hyperlipidemia. Pertinent negatives include no chest pain, myalgias or shortness of breath. Current antihyperlipidemic treatment includes statins. The current treatment provides moderate improvement of lipids. There are no compliance problems.  Risk factors for coronary artery disease include dyslipidemia, diabetes mellitus, hypertension, obesity, post-menopausal, a sedentary lifestyle and family history.  Hypertension This is a chronic problem. The current episode started more than 1 year ago. The problem has been waxing and waning since onset. The problem is uncontrolled. Pertinent negatives include no chest pain, headaches, palpitations or shortness of breath. Agents associated with hypertension include thyroid hormones. Risk factors for  coronary artery disease include diabetes mellitus, dyslipidemia, sedentary lifestyle, obesity and post-menopausal state. Past treatments include nothing. Compliance problems include diet and exercise.  Hypertensive end-organ damage includes kidney disease. Identifiable causes of hypertension include chronic renal disease and a thyroid problem.     Review of systems  Constitutional: + Minimally fluctuating body weight,  current There is no height or weight on file to calculate BMI. , no fatigue, no subjective hyperthermia, no subjective hypothermia Eyes: no blurry vision, no xerophthalmia ENT: no sore throat, no nodules palpated in throat, no dysphagia/odynophagia, no hoarseness Cardiovascular: no chest pain, no shortness of breath, no palpitations, no leg swelling Respiratory: no cough, no shortness of breath Gastrointestinal: no nausea/vomiting/diarrhea Musculoskeletal: no muscle/joint aches Skin: no rashes, no hyperemia Neurological: no tremors, no numbness, no tingling, no dizziness Psychiatric: no depression, no anxiety   Objective:    BP (!) 179/87 (BP Location: Right Arm)    Pulse 76   Wt Readings from Last 3 Encounters:  01/19/20 200 lb 6.4 oz (90.9 kg)  08/31/19 201 lb 3.2 oz (91.3 kg)  06/24/18 201 lb 3.2 oz (91.3 kg)    BP Readings from Last 3 Encounters:  05/27/20 Marland Kitchen)  179/87  01/19/20 (!) 136/77  08/31/19 131/83     Physical Exam- Limited  Constitutional:  There is no height or weight on file to calculate BMI. , not in acute distress, normal state of mind Eyes:  EOMI, no exophthalmos Neck: Supple Cardiovascular: RRR, no murmers, rubs, or gallops, no edema Respiratory: Adequate breathing efforts, no crackles, rales, rhonchi, or wheezing Musculoskeletal: no gross deformities, strength intact in all four extremities, no gross restriction of joint movements Skin:  no rashes, no hyperemia Neurological: no tremor with outstretched hands    POCT ABI Results 05/27/20    Right ABI:  1.08      Left ABI:  1.09  Right leg systolic / diastolic: 194/86 mmHg Left leg systolic / diastolic: 195/84 mmHg  Arm systolic / diastolic: 179/87 mmHG  Detailed report will be scanned into patient chart.    Recent Results (from the past 2160 hour(s))  HgB A1c     Status: Abnormal   Collection Time: 05/27/20 10:12 AM  Result Value Ref Range   Hemoglobin A1C     HbA1c POC (<> result, manual entry)     HbA1c, POC (prediabetic range)     HbA1c, POC (controlled diabetic range) 7.6 (A) 0.0 - 7.0 %     Assessment & Plan:   1. Uncontrolled type 2 diabetes mellitus with Stage 3 renal   - Ceasar Monseggy Timberlake has currently uncontrolled symptomatic type 2 DM since 74 years of age.    She presents today with her meter and logs showing slightly above target fasting and postprandial glycemic profile.  Her POCT A1c today is 7.6%, essentially unchanged from previous visit of 7.7%.  She denies any episodes of hypoglycemia.  Her recent labs reviewed and shows stable kidney function.  -her diabetes is complicated by stage 3 renal insufficiency,obesity/sedentary life and she remains at a high risk for more acute and chronic complications which include CAD, CVA, CKD, retinopathy, and neuropathy. These are all discussed in detail with her.  - Nutritional counseling repeated at each appointment due to patients tendency to fall back in to old habits.  - The patient admits there is a room for improvement in their diet and drink choices. -  Suggestion is made for the patient to avoid simple carbohydrates from their diet including Cakes, Sweet Desserts / Pastries, Ice Cream, Soda (diet and regular), Sweet Tea, Candies, Chips, Cookies, Sweet Pastries,  Store Bought Juices, Alcohol in Excess of  1-2 drinks a day, Artificial Sweeteners, Coffee Creamer, and "Sugar-free" Products. This will help patient to have stable blood glucose profile and potentially avoid unintended weight gain.   - I encouraged  the patient to switch to  unprocessed or minimally processed complex starch and increased protein intake (animal or plant source), fruits, and vegetables.   - Patient is advised to stick to a routine mealtimes to eat 3 meals  a day and avoid unnecessary snacks ( to snack only to correct hypoglycemia).  - I have approached her with the following individualized plan to manage diabetes and patient agrees:    -She is advised to maintain adequate hydration, avoid over-the-counter NSAIDs to maintain her kidney function.   -She is benefiting from reinitiation of low-dose Metformin.  I advised her to continue with her Metformin 500 mg daily with breakfast as well as continuing her glipizide 5 mg p.o. daily with breakfast .  -She is advised to continue monitoring blood sugar at least once a day, and as needed if symptoms arise, and to  call the clinic if she has readings less than 70 or greater than 200 for 3 tests in a row.  - she is not a candidate for SGLT2 inhibitors due to CKD.  2) BP/HTN:  Her blood pressure is not controlled to target.  She is not currently on any antihypertensive medications at this time.  She does monitor BP at home routinely, and reports she typically has good results less than 140/90.  She will be considered for low dose ACE/ARB at next visit if BP continues to be elevated.  3) Lipids/HPL:  Her recent lipid panel from 01/16/20 shows controlled LDL at 67.  She is advised to continue Lipitor 20 mg po daily at bedtime.  Side effects and precautions discussed with her.  4) Hypothyroidism-long-term diagnosis, took levothyroxine for more than 10 years. -She does not have recent TFTs to review.  Her Levothyroxine was increased to 75 mcg at last visit.  She is advised to continue current dose for now.  Will recheck TFTs prior to next visit and adjust dose if necessary.   - We discussed about the correct intake of her thyroid hormone, on empty stomach at fasting, with water, separated  by at least 30 minutes from breakfast and other medications,  and separated by more than 4 hours from calcium, iron, multivitamins, acid reflux medications (PPIs). -Patient is made aware of the fact that thyroid hormone replacement is needed for life, dose to be adjusted by periodic monitoring of thyroid function tests.  5) Chronic Care/Health Maintenance: -she  is on Statin medications and  is encouraged to initiate and continue to follow up with Ophthalmology, Dentist,  Podiatrist at least yearly or according to recommendations, and advised to  stay away from smoking. I have recommended yearly flu vaccine and pneumonia vaccine at least every 5 years; moderate intensity exercise for up to 150 minutes weekly; and  sleep for at least 7 hours a day.  - I advised patient to maintain close follow up with Lamont Snowball, MD for primary care needs.  - Time spent on this patient care encounter:  35 min, of which > 50% was spent in  counseling and the rest reviewing her blood glucose logs , discussing her hypoglycemia and hyperglycemia episodes, reviewing her current and  previous labs / studies  ( including abstraction from other facilities) and medications  doses and developing a  long term treatment plan and documenting her care.   Please refer to Patient Instructions for Blood Glucose Monitoring and Insulin/Medications Dosing Guide"  in media tab for additional information. Please  also refer to " Patient Self Inventory" in the Media  tab for reviewed elements of pertinent patient history.  [name] participated in the discussions, expressed understanding, and voiced agreement with the above plans.  All questions were answered to her satisfaction. she is encouraged to contact clinic should she have any questions or concerns prior to her return visit.   Follow up plan: - Return in about 6 months (around 11/24/2020) for Diabetes follow up with A1c in office, Thyroid follow up, Previsit labs, Bring  glucometer and logs.  Ronny Bacon, [facility] [address] [place], [place] [address] Phone: [phone] Fax: [phone]   05/27/2020, 11:05 AM

## 2020-05-27 NOTE — Patient Instructions (Signed)

## 2020-07-08 ENCOUNTER — Other Ambulatory Visit: Payer: Self-pay | Admitting: "Endocrinology

## 2020-09-17 ENCOUNTER — Telehealth: Payer: Self-pay

## 2020-09-17 NOTE — Telephone Encounter (Signed)
Returned call to patient and advised, verbalized understanding 

## 2020-09-17 NOTE — Telephone Encounter (Signed)
Patient called with concerns with increased blood glucose levels, has been under a lot of stress lately with medical issues of husband Freddy, BG readings are as follows all am fasting, 3/15- 177, 3/16- 183, 3/17- 164, 3/18- 169, 3/19- 161, 3/20- 208, 3/21- 179, 3/22- 175. Taking Metformin 500mg  once in am and Glipizide ER 5mg  once in am. Please advise if any changes.

## 2020-09-17 NOTE — Telephone Encounter (Signed)
No changes at this time.  Work on diet as best as possible.  I don't see the need to add change anything just yet.  We want to avoid hypoglycemia first and foremost.

## 2020-10-14 ENCOUNTER — Other Ambulatory Visit: Payer: Self-pay | Admitting: "Endocrinology

## 2020-10-14 ENCOUNTER — Other Ambulatory Visit: Payer: Self-pay | Admitting: Nurse Practitioner

## 2020-10-14 NOTE — Telephone Encounter (Signed)
Please advise 

## 2020-11-20 LAB — COMPREHENSIVE METABOLIC PANEL
ALT: 25 IU/L (ref 0–32)
AST: 22 IU/L (ref 0–40)
Albumin/Globulin Ratio: 1.8 (ref 1.2–2.2)
Albumin: 4.4 g/dL (ref 3.7–4.7)
Alkaline Phosphatase: 122 IU/L — ABNORMAL HIGH (ref 44–121)
BUN/Creatinine Ratio: 15 (ref 12–28)
BUN: 17 mg/dL (ref 8–27)
Bilirubin Total: 0.5 mg/dL (ref 0.0–1.2)
CO2: 22 mmol/L (ref 20–29)
Calcium: 9.2 mg/dL (ref 8.7–10.3)
Chloride: 103 mmol/L (ref 96–106)
Creatinine, Ser: 1.16 mg/dL — ABNORMAL HIGH (ref 0.57–1.00)
Globulin, Total: 2.4 g/dL (ref 1.5–4.5)
Glucose: 185 mg/dL — ABNORMAL HIGH (ref 65–99)
Potassium: 4.8 mmol/L (ref 3.5–5.2)
Sodium: 142 mmol/L (ref 134–144)
Total Protein: 6.8 g/dL (ref 6.0–8.5)
eGFR: 49 mL/min/{1.73_m2} — ABNORMAL LOW (ref >59)

## 2020-11-20 LAB — TSH: TSH: 1.97 u[IU]/mL (ref 0.450–4.500)

## 2020-11-20 LAB — T4, FREE: Free T4: 1.33 ng/dL (ref 0.82–1.77)

## 2020-11-25 ENCOUNTER — Other Ambulatory Visit: Payer: Self-pay | Admitting: Nurse Practitioner

## 2020-11-26 ENCOUNTER — Encounter: Payer: Self-pay | Admitting: Nurse Practitioner

## 2020-11-26 ENCOUNTER — Other Ambulatory Visit: Payer: Self-pay

## 2020-11-26 ENCOUNTER — Ambulatory Visit (INDEPENDENT_AMBULATORY_CARE_PROVIDER_SITE_OTHER): Payer: Medicare Other | Admitting: Nurse Practitioner

## 2020-11-26 VITALS — BP 155/83 | HR 90 | Ht 64.0 in | Wt 196.0 lb

## 2020-11-26 DIAGNOSIS — N1831 Chronic kidney disease, stage 3a: Secondary | ICD-10-CM | POA: Diagnosis not present

## 2020-11-26 DIAGNOSIS — E039 Hypothyroidism, unspecified: Secondary | ICD-10-CM | POA: Diagnosis not present

## 2020-11-26 DIAGNOSIS — E1122 Type 2 diabetes mellitus with diabetic chronic kidney disease: Secondary | ICD-10-CM | POA: Diagnosis not present

## 2020-11-26 LAB — POCT GLYCOSYLATED HEMOGLOBIN (HGB A1C): Hemoglobin A1C: 7.6 % — AB (ref 4.0–5.6)

## 2020-11-26 LAB — POCT UA - MICROALBUMIN
Creatinine, POC: 300 mg/dL
Microalbumin Ur, POC: 80 mg/L

## 2020-11-26 NOTE — Progress Notes (Signed)
11/26/2020, 11:59 AM          Endocrinology follow-up note   Subjective:    Patient ID: Carol Adams, female    DOB: [birth year].  Carol Adams is being seen in follow-up for management of currently uncontrolled symptomatic type 2 diabetes, hypothyroidism, hyperlipidemia, hypertension.  She is accompanied by her husband today.  PMD:   Sherrilee Gilles, DO.   Past Medical History:  Diagnosis Date  . Diabetes mellitus, type II ([place])   . Hypothyroidism    Past Surgical History:  Procedure Laterality Date  . ABDOMINAL HYSTERECTOMY    . CHOLECYSTECTOMY     Social History   Socioeconomic History  . Marital status: Married    Spouse name: Not on file  . Number of children: Not on file  . Years of education: Not on file  . Highest education level: Not on file  Occupational History  . Not on file  Tobacco Use  . Smoking status: Never Smoker  . Smokeless tobacco: Never Used  Vaping Use  . Vaping Use: Never used  Substance and Sexual Activity  . Alcohol use: Never  . Drug use: Never  . Sexual activity: Not on file  Other Topics Concern  . Not on file  Social History Narrative  . Not on file   Social Determinants of Health   Financial Resource Strain: Not on file  Food Insecurity: Not on file  Transportation Needs: Not on file  Physical Activity: Not on file  Stress: Not on file  Social Connections: Not on file   Outpatient Encounter Medications as of 11/26/2020  Medication Sig  . atorvastatin (LIPITOR) 20 MG tablet TAKE 1 TABLET BY MOUTH  DAILY  . glipiZIDE (GLUCOTROL XL) 5 MG 24 hr tablet TAKE 1 TABLET BY MOUTH DAILY WITH BREAKFAST  . glucose blood (ACCU-CHEK GUIDE) test strip Test BG 1-2 times daily. E11.65  . Lancets MISC 1 each by Does not apply route as directed.  . loratadine (CLARITIN) 10 MG tablet Take 10 mg by mouth daily.  . metFORMIN (GLUCOPHAGE-XR) 500 MG 24 hr tablet TAKE 1 TABLET BY MOUTH  DAILY WITH BREAKFAST  . Multiple Vitamin  (MULTIVITAMIN) capsule Take 1 capsule by mouth daily.  Marland Kitchen omeprazole (PRILOSEC) 20 MG capsule TAKE 1 CAPSULE BY MOUTH EVERY DAY  . PARoxetine (PAXIL) 20 MG tablet Take 20 mg by mouth daily.  . trandolapril (MAVIK) 4 MG tablet daily as needed.  . [DISCONTINUED] levothyroxine (SYNTHROID) 75 MCG tablet TAKE 1 TABLET BY MOUTH  DAILY BEFORE BREAKFAST   No facility-administered encounter medications on file as of 11/26/2020.    ALLERGIES: Allergies  Allergen Reactions  . Cephalexin Rash    VACCINATION STATUS: Immunization History  Administered Date(s) Administered  . PFIZER(Purple Top)SARS-COV-2 Vaccination 09/16/2019, 10/07/2019    Diabetes She presents for her follow-up diabetic visit. She has type 2 diabetes mellitus. The initial diagnosis of diabetes was made 2 years (She was recently diagnosed with type 2 diabetes at age 18 years.) ago. Her disease course has been stable. There are no hypoglycemic associated symptoms. Pertinent negatives for hypoglycemia include no confusion, headaches, pallor or seizures. There are no diabetic associated symptoms. Pertinent negatives for diabetes include no chest pain, no polydipsia, no polyphagia and no polyuria. There are no hypoglycemic complications. Symptoms are stable. Diabetic complications include nephropathy. Risk factors for coronary artery disease include diabetes mellitus, dyslipidemia, hypertension, obesity, sedentary lifestyle and post-menopausal. Current diabetic treatment includes oral agent (dual  therapy). She is compliant with treatment all of the time. Her weight is decreasing steadily. She is following a diabetic diet. When asked about meal planning, she reported none. She has not had a previous visit with a dietitian. She participates in exercise intermittently. Her home blood glucose trend is fluctuating minimally. Her breakfast blood glucose range is generally 130-140 mg/dl. Her bedtime blood glucose range is generally 180-200 mg/dl. (She  presents today, accompanied by her husband, with her meter and logs showing stable, near target fasting and postprandial glycemic profile.  Her POCT A1c today is 7.6%, unchanged from previous visit.  She was expecting higher A1c due to multiple illnesses including COVID and bronchitis where she was treated with oral steroids causing temporary elevation in glucose.  She has since gotten back on track with her current medication regimen.  There are no episodes of hypoglycemia documented or reported. ) An ACE inhibitor/angiotensin II receptor blocker is not being taken. She does not see a podiatrist.Eye exam is current.  Hyperlipidemia This is a chronic problem. The current episode started more than 1 year ago. The problem is controlled. Recent lipid tests were reviewed and are normal. Exacerbating diseases include chronic renal disease, diabetes, hypothyroidism and obesity. There are no known factors aggravating her hyperlipidemia. Pertinent negatives include no chest pain, myalgias or shortness of breath. Current antihyperlipidemic treatment includes statins. The current treatment provides moderate improvement of lipids. There are no compliance problems.  Risk factors for coronary artery disease include dyslipidemia, diabetes mellitus, hypertension, obesity, post-menopausal, a sedentary lifestyle and family history.  Hypertension This is a chronic problem. The current episode started more than 1 year ago. The problem has been gradually improving since onset. The problem is controlled. Pertinent negatives include no chest pain, headaches, palpitations or shortness of breath. Agents associated with hypertension include thyroid hormones. Risk factors for coronary artery disease include diabetes mellitus, dyslipidemia, sedentary lifestyle, obesity and post-menopausal state. Past treatments include nothing. Compliance problems include diet and exercise.  Hypertensive end-organ damage includes kidney disease.  Identifiable causes of hypertension include chronic renal disease and a thyroid problem.     Review of systems  Constitutional: + Minimally fluctuating body weight,  current Body mass index is 33.64 kg/m. , no fatigue, no subjective hyperthermia, no subjective hypothermia Eyes: no blurry vision, no xerophthalmia ENT: no sore throat, no nodules palpated in throat, no dysphagia/odynophagia, no hoarseness Cardiovascular: no chest pain, no shortness of breath, no palpitations, no leg swelling Respiratory: no cough, no shortness of breath Gastrointestinal: no nausea/vomiting/diarrhea Musculoskeletal: no muscle/joint aches Skin: no rashes, no hyperemia Neurological: no tremors, no numbness, no tingling, no dizziness Psychiatric: no depression, no anxiety   Objective:    BP (!) 155/83   Pulse 90   Ht 5' 4"  (1.626 m)   Wt 196 lb (88.9 kg)   BMI 33.64 kg/m   Wt Readings from Last 3 Encounters:  11/26/20 196 lb (88.9 kg)  01/19/20 200 lb 6.4 oz (90.9 kg)  08/31/19 201 lb 3.2 oz (91.3 kg)    BP Readings from Last 3 Encounters:  11/26/20 (!) 155/83  05/27/20 (!) 179/87  01/19/20 (!) 136/77     Physical Exam- Limited  Constitutional:  Body mass index is 33.64 kg/m. , not in acute distress, normal state of mind Eyes:  EOMI, no exophthalmos Neck: Supple Cardiovascular: RRR, no murmurs, rubs, or gallops, no edema Respiratory: Adequate breathing efforts, no crackles, rales, rhonchi, or wheezing Musculoskeletal: no gross deformities, strength intact in all four extremities,  no gross restriction of joint movements Skin:  no rashes, no hyperemia Neurological: no tremor with outstretched hands   Recent Results (from the past 2160 hour(s))  Comprehensive metabolic panel     Status: Abnormal   Collection Time: 11/19/20  8:30 AM  Result Value Ref Range   Glucose 185 (H) 65 - 99 mg/dL   BUN 17 8 - 27 mg/dL   Creatinine, Ser 1.16 (H) 0.57 - 1.00 mg/dL   eGFR 49 (L) >59 mL/min/1.73    BUN/Creatinine Ratio 15 12 - 28   Sodium 142 134 - 144 mmol/L   Potassium 4.8 3.5 - 5.2 mmol/L   Chloride 103 96 - 106 mmol/L   CO2 22 20 - 29 mmol/L   Calcium 9.2 8.7 - 10.3 mg/dL   Total Protein 6.8 6.0 - 8.5 g/dL   Albumin 4.4 3.7 - 4.7 g/dL   Globulin, Total 2.4 1.5 - 4.5 g/dL   Albumin/Globulin Ratio 1.8 1.2 - 2.2   Bilirubin Total 0.5 0.0 - 1.2 mg/dL   Alkaline Phosphatase 122 (H) 44 - 121 IU/L   AST 22 0 - 40 IU/L   ALT 25 0 - 32 IU/L  TSH     Status: None   Collection Time: 11/19/20  8:30 AM  Result Value Ref Range   TSH 1.970 0.450 - 4.500 uIU/mL  T4, free     Status: None   Collection Time: 11/19/20  8:30 AM  Result Value Ref Range   Free T4 1.33 0.82 - 1.77 ng/dL  POCT UA - Microalbumin     Status: Abnormal   Collection Time: 11/26/20 10:30 AM  Result Value Ref Range   Microalbumin Ur, POC 80 mg/L   Creatinine, POC 300 mg/dL   Albumin/Creatinine Ratio, Urine, POC 30-300   HgB A1c     Status: Abnormal   Collection Time: 11/26/20 10:30 AM  Result Value Ref Range   Hemoglobin A1C 7.6 (A) 4.0 - 5.6 %   HbA1c POC (<> result, manual entry)     HbA1c, POC (prediabetic range)     HbA1c, POC (controlled diabetic range)       Assessment & Plan:   1) 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    She presents today, accompanied by her husband, with her meter and logs showing stable, near target fasting and postprandial glycemic profile.  Her POCT A1c today is 7.6%, unchanged from previous visit.  She was expecting higher A1c due to multiple illnesses including COVID and bronchitis where she was treated with oral steroids causing temporary elevation in glucose.  She has since gotten back on track with her current medication regimen.  There are no episodes of hypoglycemia documented or reported.  Her recent labs reviewed and shows stable kidney function.  -her diabetes is  complicated by stage 3 renal insufficiency,obesity/sedentary life and she remains at a high risk for more acute and chronic complications which include CAD, CVA, CKD, retinopathy, and neuropathy. These are all discussed in detail with her.  - Nutritional counseling repeated at each appointment due to patients tendency to fall back in to old habits.  - The patient admits there is a room for improvement in their diet and drink choices. -  Suggestion is made for the patient to avoid simple carbohydrates from their diet including Cakes, Sweet Desserts / Pastries, Ice Cream, Soda (diet and regular), Sweet Tea,  Candies, Chips, Cookies, Sweet Pastries, Store Bought Juices, Alcohol in Excess of 1-2 drinks a day, Artificial Sweeteners, Coffee Creamer, and "Sugar-free" Products. This will help patient to have stable blood glucose profile and potentially avoid unintended weight gain.   - I encouraged the patient to switch to unprocessed or minimally processed complex starch and increased protein intake (animal or plant source), fruits, and vegetables.   - Patient is advised to stick to a routine mealtimes to eat 3 meals a day and avoid unnecessary snacks (to snack only to correct hypoglycemia).  - I have approached her with the following individualized plan to manage diabetes and patient agrees:    -She is advised to maintain adequate hydration, avoid over-the-counter NSAIDs to maintain her kidney function.    -Given her stable glycemic profile, she is advised to continue Metformin 500 mg ER daily with breakfast and Glipizide 5 mg XL daily with breakfast.   -She is advised to continue monitoring blood sugar at least once a day, and as needed if symptoms arise, and to call the clinic if she has readings less than 70 or greater than 200 for 3 tests in a row.  - she is not a candidate for SGLT2 inhibitors due to CKD.  2) BP/HTN:  Her blood pressure is controlled to target.  She is not currently on any  antihypertensive medications at this time.   3) Lipids/HPL:  Her recent lipid panel from 01/16/20 shows controlled LDL at 67.  She is advised to continue Lipitor 20 mg po daily at bedtime.  Side effects and precautions discussed with her.  4) Hypothyroidism-long-term diagnosis, took levothyroxine for more than 10 years.  -Her previsit thyroid function tests are consistent with appropriate hormone replacement.  She is advised to continue Levothyroxine 75 mcg po daily before breakfast.     - We discussed about the correct intake of her thyroid hormone, on empty stomach at fasting, with water, separated by at least 30 minutes from breakfast and other medications,  and separated by more than 4 hours from calcium, iron, multivitamins, acid reflux medications (PPIs). -Patient is made aware of the fact that thyroid hormone replacement is needed for life, dose to be adjusted by periodic monitoring of thyroid function tests.  5) Chronic Care/Health Maintenance: -she is on Statin medications and is encouraged to initiate and continue to follow up with Ophthalmology, Dentist, Podiatrist at least yearly or according to recommendations, and advised to stay away from smoking. I have recommended yearly flu vaccine and pneumonia vaccine at least every 5 years; moderate intensity exercise for up to 150 minutes weekly; and  sleep for at least 7 hours a day.  - I advised patient to maintain close follow up with Sherrilee Gilles, DO for primary care needs.    I spent 40 minutes in the care of the patient today including review of labs from [facility], Lipids, Thyroid Function, Hematology (current and previous including abstractions from other facilities); face-to-face time discussing  her blood glucose readings/logs, discussing hypoglycemia and hyperglycemia episodes and symptoms, medications doses, her options of short and long term treatment based on the latest standards of care / guidelines;  discussion about incorporating  lifestyle medicine;  and documenting the encounter.    Please refer to Patient Instructions for Blood Glucose Monitoring and Insulin/Medications Dosing Guide"  in media tab for additional information. Please  also refer to " Patient Self Inventory" in the Media  tab for reviewed elements of pertinent patient history.  Carol Adams participated  in the discussions, expressed understanding, and voiced agreement with the above plans.  All questions were answered to her satisfaction. she is encouraged to contact clinic should she have any questions or concerns prior to her return visit.    Follow up plan: - Return in about 6 months (around 05/28/2021) for Diabetes F/U with A1c in office, Thyroid follow up, Previsit labs, Bring meter and logs.  Rayetta Pigg, [facility] [address] [place], [place] [address] Phone: [phone] Fax: [phone]   11/26/2020, 11:59 AM

## 2020-11-26 NOTE — Patient Instructions (Signed)
Diabetes Mellitus and Nutrition, Adult When you have diabetes, or diabetes mellitus, it is very important to have healthy eating habits because your blood sugar (glucose) levels are greatly affected by what you eat and drink. Eating healthy foods in the right amounts, at about the same times every day, can help you:  Control your blood glucose.  Lower your risk of heart disease.  Improve your blood pressure.  Reach or maintain a healthy weight. What can affect my meal plan? Every person with diabetes is different, and each person has different needs for a meal plan. Your health care provider may recommend that you work with a dietitian to make a meal plan that is best for you. Your meal plan may vary depending on factors such as:  The calories you need.  The medicines you take.  Your weight.  Your blood glucose, blood pressure, and cholesterol levels.  Your activity level.  Other health conditions you have, such as heart or kidney disease. How do carbohydrates affect me? Carbohydrates, also called carbs, affect your blood glucose level more than any other type of food. Eating carbs naturally raises the amount of glucose in your blood. Carb counting is a method for keeping track of how many carbs you eat. Counting carbs is important to keep your blood glucose at a healthy level, especially if you use insulin or take certain oral diabetes medicines. It is important to know how many carbs you can safely have in each meal. This is different for every person. Your dietitian can help you calculate how many carbs you should have at each meal and for each snack. How does alcohol affect me? Alcohol can cause a sudden decrease in blood glucose (hypoglycemia), especially if you use insulin or take certain oral diabetes medicines. Hypoglycemia can be a life-threatening condition. Symptoms of hypoglycemia, such as sleepiness, dizziness, and confusion, are similar to symptoms of having too much  alcohol.  Do not drink alcohol if: ? Your health care provider tells you not to drink. ? You are pregnant, may be pregnant, or are planning to become pregnant.  If you drink alcohol: ? Do not drink on an empty stomach. ? Limit how much you use to:  0-1 drink a day for women.  0-2 drinks a day for men. ? Be aware of how much alcohol is in your drink. In the [place] one drink equals one 12 oz bottle of beer (355 mL), one 5 oz glass of wine (148 mL), or one 1 oz glass of hard liquor (44 mL). ? Keep yourself hydrated with water, diet soda, or unsweetened iced tea.  Keep in mind that regular soda, juice, and other mixers may contain a lot of sugar and must be counted as carbs. What are tips for following this plan? Reading food labels  Start by checking the serving size on the "Nutrition Facts" label of packaged foods and drinks. The amount of calories, carbs, fats, and other nutrients listed on the label is based on one serving of the item. Many items contain more than one serving per package.  Check the total grams (g) of carbs in one serving. You can calculate the number of servings of carbs in one serving by dividing the total carbs by 15. For example, if a food has 30 g of total carbs per serving, it would be equal to 2 servings of carbs.  Check the number of grams (g) of saturated fats and trans fats in one serving. Choose foods that have   a low amount or none of these fats.  Check the number of milligrams (mg) of salt (sodium) in one serving. Most people should limit total sodium intake to less than 2,300 mg per day.  Always check the nutrition information of foods labeled as "low-fat" or "nonfat." These foods may be higher in added sugar or refined carbs and should be avoided.  Talk to your dietitian to identify your daily goals for nutrients listed on the label. Shopping  Avoid buying canned, pre-made, or processed foods. These foods tend to be high in fat, sodium, and added  sugar.  Shop around the outside edge of the grocery store. This is where you will most often find fresh fruits and vegetables, bulk grains, fresh meats, and fresh dairy. Cooking  Use low-heat cooking methods, such as baking, instead of high-heat cooking methods like deep frying.  Cook using healthy oils, such as olive, canola, or sunflower oil.  Avoid cooking with butter, cream, or high-fat meats. Meal planning  Eat meals and snacks regularly, preferably at the same times every day. Avoid going long periods of time without eating.  Eat foods that are high in fiber, such as fresh fruits, vegetables, beans, and whole grains. Talk with your dietitian about how many servings of carbs you can eat at each meal.  Eat 4-6 oz (112-168 g) of lean protein each day, such as lean meat, chicken, fish, eggs, or tofu. One ounce (oz) of lean protein is equal to: ? 1 oz (28 g) of meat, chicken, or fish. ? 1 egg. ?  cup (62 g) of tofu.  Eat some foods each day that contain healthy fats, such as avocado, nuts, seeds, and fish.   What foods should I eat? Fruits Berries. Apples. Oranges. Peaches. Apricots. Plums. Grapes. Mango. Papaya. Pomegranate. Kiwi. Cherries. Vegetables Lettuce. Spinach. Leafy greens, including kale, chard, collard greens, and mustard greens. Beets. Cauliflower. Cabbage. Broccoli. Carrots. Green beans. Tomatoes. Peppers. Onions. Cucumbers. Brussels sprouts. Grains Whole grains, such as whole-wheat or whole-grain bread, crackers, tortillas, cereal, and pasta. Unsweetened oatmeal. Quinoa. Brown or wild rice. Meats and other proteins Seafood. Poultry without skin. Lean cuts of poultry and beef. Tofu. Nuts. Seeds. Dairy Low-fat or fat-free dairy products such as milk, yogurt, and cheese. The items listed above may not be a complete list of foods and beverages you can eat. Contact a dietitian for more information. What foods should I avoid? Fruits Fruits canned with  syrup. Vegetables Canned vegetables. Frozen vegetables with butter or cream sauce. Grains Refined white flour and flour products such as bread, pasta, snack foods, and cereals. Avoid all processed foods. Meats and other proteins Fatty cuts of meat. Poultry with skin. Breaded or fried meats. Processed meat. Avoid saturated fats. Dairy Full-fat yogurt, cheese, or milk. Beverages Sweetened drinks, such as soda or iced tea. The items listed above may not be a complete list of foods and beverages you should avoid. Contact a dietitian for more information. Questions to ask a health care provider  Do I need to meet with a diabetes educator?  Do I need to meet with a dietitian?  What number can I call if I have questions?  When are the best times to check my blood glucose? Where to find more information:  American Diabetes Association: diabetes.org  Academy of Nutrition and Dietetics: www.eatright.org  National Institute of Diabetes and Digestive and Kidney Diseases: www.niddk.nih.gov  Association of Diabetes Care and Education Specialists: www.diabeteseducator.org Summary  It is important to have healthy eating   habits because your blood sugar (glucose) levels are greatly affected by what you eat and drink.  A healthy meal plan will help you control your blood glucose and maintain a healthy lifestyle.  Your health care provider may recommend that you work with a dietitian to make a meal plan that is best for you.  Keep in mind that carbohydrates (carbs) and alcohol have immediate effects on your blood glucose levels. It is important to count carbs and to use alcohol carefully. This information is not intended to replace advice given to you by your health care provider. Make sure you discuss any questions you have with your health care provider. Document Revised: 05/23/2019 Document Reviewed: 05/23/2019 Elsevier Patient Education  2021 [employer]  

## 2021-01-05 ENCOUNTER — Other Ambulatory Visit: Payer: Self-pay | Admitting: "Endocrinology

## 2021-01-05 DIAGNOSIS — N1831 Chronic kidney disease, stage 3a: Secondary | ICD-10-CM

## 2021-01-05 DIAGNOSIS — E1122 Type 2 diabetes mellitus with diabetic chronic kidney disease: Secondary | ICD-10-CM

## 2021-01-12 ENCOUNTER — Other Ambulatory Visit: Payer: Self-pay | Admitting: "Endocrinology

## 2021-04-09 ENCOUNTER — Other Ambulatory Visit: Payer: Self-pay | Admitting: Nurse Practitioner

## 2021-04-09 DIAGNOSIS — E1122 Type 2 diabetes mellitus with diabetic chronic kidney disease: Secondary | ICD-10-CM

## 2021-04-11 ENCOUNTER — Other Ambulatory Visit: Payer: Self-pay | Admitting: Nurse Practitioner

## 2021-05-22 LAB — COMPREHENSIVE METABOLIC PANEL
ALT: 28 IU/L (ref 0–32)
AST: 24 IU/L (ref 0–40)
Albumin/Globulin Ratio: 1.7 (ref 1.2–2.2)
Albumin: 4.4 g/dL (ref 3.7–4.7)
Alkaline Phosphatase: 108 IU/L (ref 44–121)
BUN/Creatinine Ratio: 13 (ref 12–28)
BUN: 14 mg/dL (ref 8–27)
Bilirubin Total: 0.4 mg/dL (ref 0.0–1.2)
CO2: 24 mmol/L (ref 20–29)
Calcium: 8.9 mg/dL (ref 8.7–10.3)
Chloride: 103 mmol/L (ref 96–106)
Creatinine, Ser: 1.12 mg/dL — ABNORMAL HIGH (ref 0.57–1.00)
Globulin, Total: 2.6 g/dL (ref 1.5–4.5)
Glucose: 176 mg/dL — ABNORMAL HIGH (ref 70–99)
Potassium: 4.8 mmol/L (ref 3.5–5.2)
Sodium: 144 mmol/L (ref 134–144)
Total Protein: 7 g/dL (ref 6.0–8.5)
eGFR: 51 mL/min/{1.73_m2} — ABNORMAL LOW (ref >59)

## 2021-05-22 LAB — T4, FREE: Free T4: 1.57 ng/dL (ref 0.82–1.77)

## 2021-05-22 LAB — TSH: TSH: 2.16 u[IU]/mL (ref 0.450–4.500)

## 2021-05-26 ENCOUNTER — Other Ambulatory Visit: Payer: Self-pay | Admitting: Nurse Practitioner

## 2021-05-27 NOTE — Patient Instructions (Signed)

## 2021-05-28 ENCOUNTER — Ambulatory Visit (INDEPENDENT_AMBULATORY_CARE_PROVIDER_SITE_OTHER): Payer: Medicare Other | Admitting: Nurse Practitioner

## 2021-05-28 ENCOUNTER — Encounter: Payer: Self-pay | Admitting: Nurse Practitioner

## 2021-05-28 VITALS — BP 153/73 | HR 71 | Ht 64.0 in | Wt 200.0 lb

## 2021-05-28 DIAGNOSIS — E782 Mixed hyperlipidemia: Secondary | ICD-10-CM | POA: Diagnosis not present

## 2021-05-28 DIAGNOSIS — E039 Hypothyroidism, unspecified: Secondary | ICD-10-CM | POA: Diagnosis not present

## 2021-05-28 DIAGNOSIS — I10 Essential (primary) hypertension: Secondary | ICD-10-CM | POA: Diagnosis not present

## 2021-05-28 DIAGNOSIS — N1831 Chronic kidney disease, stage 3a: Secondary | ICD-10-CM

## 2021-05-28 DIAGNOSIS — E559 Vitamin D deficiency, unspecified: Secondary | ICD-10-CM

## 2021-05-28 DIAGNOSIS — E1122 Type 2 diabetes mellitus with diabetic chronic kidney disease: Secondary | ICD-10-CM | POA: Diagnosis not present

## 2021-05-28 LAB — POCT GLYCOSYLATED HEMOGLOBIN (HGB A1C): HbA1c POC (<> result, manual entry): 7.9 % (ref 4.0–5.6)

## 2021-05-28 NOTE — Progress Notes (Signed)
05/28/2021, 10:43 AM          Endocrinology follow-up note   Subjective:    Patient ID: Carol Adams, female    DOB: Aug 09, 1945.  Carol Adams is being seen in follow-up for management of currently uncontrolled symptomatic type 2 diabetes, hypothyroidism, hyperlipidemia, hypertension.  She is accompanied by her husband today.  PMD:   Sherrilee Gilles, DO.   Past Medical History:  Diagnosis Date   Diabetes mellitus, type II ([place])    Hypothyroidism    Past Surgical History:  Procedure Laterality Date   ABDOMINAL HYSTERECTOMY     CHOLECYSTECTOMY     Social History   Socioeconomic History   Marital status: Married    Spouse name: Not on file   Number of children: Not on file   Years of education: Not on file   Highest education level: Not on file  Occupational History   Not on file  Tobacco Use   Smoking status: Never   Smokeless tobacco: Never  Vaping Use   Vaping Use: Never used  Substance and Sexual Activity   Alcohol use: Never   Drug use: Never   Sexual activity: Not on file  Other Topics Concern   Not on file  Social History Narrative   Not on file   Social Determinants of Health   Financial Resource Strain: Not on file  Food Insecurity: Not on file  Transportation Needs: Not on file  Physical Activity: Not on file  Stress: Not on file  Social Connections: Not on file   Outpatient Encounter Medications as of 05/28/2021  Medication Sig   Accu-Chek FastClix Lancets MISC Use 1-2 times daily. E11.65   glipiZIDE (GLUCOTROL XL) 5 MG 24 hr tablet TAKE 1 TABLET BY MOUTH DAILY WITH BREAKFAST   glucose blood (ACCU-CHEK GUIDE) test strip Use to test BG 1-2 times daily.   levothyroxine (SYNTHROID) 75 MCG tablet TAKE 1 TABLET BY MOUTH EVERY DAY BEFORE BREAKFAST   loratadine (CLARITIN) 10 MG tablet Take 10 mg by mouth daily.   metFORMIN (GLUCOPHAGE-XR) 500 MG 24 hr tablet TAKE 1 TABLET BY MOUTH EVERY DAY WITH BREAKFAST   Multiple Vitamin  (MULTIVITAMIN) capsule Take 1 capsule by mouth daily.   omeprazole (PRILOSEC) 20 MG capsule TAKE 1 CAPSULE BY MOUTH EVERY DAY   PARoxetine (PAXIL) 20 MG tablet Take 20 mg by mouth daily.   trandolapril (MAVIK) 4 MG tablet daily as needed.   [DISCONTINUED] atorvastatin (LIPITOR) 20 MG tablet TAKE 1 TABLET BY MOUTH  DAILY   No facility-administered encounter medications on file as of 05/28/2021.    ALLERGIES: Allergies  Allergen Reactions   Cephalexin Rash    VACCINATION STATUS: There is no immunization history for the selected administration types on file for this patient.  Diabetes She presents for her follow-up diabetic visit. She has type 2 diabetes mellitus. The initial diagnosis of diabetes was made 2 years (She was recently diagnosed with type 2 diabetes at age 71 years.) ago. Her disease course has been stable. There are no hypoglycemic associated symptoms. Pertinent negatives for hypoglycemia include no confusion, headaches, pallor or seizures. There are no diabetic associated symptoms. Pertinent negatives for diabetes include no polydipsia, no polyphagia and no polyuria. There are no hypoglycemic complications. Symptoms are stable. Diabetic complications include nephropathy. Risk factors for coronary artery disease include diabetes mellitus, dyslipidemia, hypertension, obesity, sedentary lifestyle and post-menopausal. Current diabetic treatment includes oral agent (dual therapy). She is compliant with treatment  all of the time. Her weight is fluctuating minimally. She is following a diabetic diet. When asked about meal planning, she reported none. She has not had a previous visit with a dietitian. She participates in exercise intermittently. Her home blood glucose trend is fluctuating minimally. Her breakfast blood glucose range is generally 130-140 mg/dl. (She presents today with her logs, no meter, showing stable at goal fasting glycemic profile.  Her POCT A1c today is 7.9%, increasing  slightly from last visit of 7.6%.  She denies any significant hypoglycemia. She does report more stress lately and finds herself snacking at times. ) An ACE inhibitor/angiotensin II receptor blocker is not being taken. She does not see a podiatrist.Eye exam is current.  Hypertension This is a chronic problem. The current episode started more than 1 year ago. The problem has been waxing and waning since onset. The problem is uncontrolled. Pertinent negatives include no headaches or palpitations. Agents associated with hypertension include thyroid hormones. Risk factors for coronary artery disease include diabetes mellitus, dyslipidemia, sedentary lifestyle, obesity and post-menopausal state. Past treatments include nothing. Compliance problems include diet and exercise.  Hypertensive end-organ damage includes kidney disease. Identifiable causes of hypertension include a thyroid problem.    Review of systems  Constitutional: + Minimally fluctuating body weight,  current Body mass index is 34.33 kg/m. , no fatigue, no subjective hyperthermia, no subjective hypothermia Eyes: no blurry vision, no xerophthalmia ENT: no sore throat, no nodules palpated in throat, no dysphagia/odynophagia, no hoarseness Cardiovascular: no chest pain, no shortness of breath, no palpitations, no leg swelling Respiratory: no cough, no shortness of breath Gastrointestinal: no nausea/vomiting/diarrhea Musculoskeletal: no muscle/joint aches Skin: no rashes, no hyperemia Neurological: no tremors, no numbness, no tingling, no dizziness Psychiatric: no depression, no anxiety   Objective:    BP (!) 153/73   Pulse 71   Ht 5' 4" (1.626 m)   Wt 200 lb (90.7 kg)   BMI 34.33 kg/m   Wt Readings from Last 3 Encounters:  05/28/21 200 lb (90.7 kg)  11/26/20 196 lb (88.9 kg)  01/19/20 200 lb 6.4 oz (90.9 kg)    BP Readings from Last 3 Encounters:  05/28/21 (!) 153/73  11/26/20 (!) 155/83  05/27/20 (!) 179/87      Physical Exam- Limited  Constitutional:  Body mass index is 34.33 kg/m. , not in acute distress, normal state of mind Eyes:  EOMI, no exophthalmos Neck: Supple Cardiovascular: RRR, no murmers, rubs, or gallops, no edema Respiratory: Adequate breathing efforts, no crackles, rales, rhonchi, or wheezing Musculoskeletal: no gross deformities, strength intact in all four extremities, no gross restriction of joint movements Skin:  no rashes, no hyperemia Neurological: no tremor with outstretched hands     Recent Results (from the past 2160 hour(s))  Comprehensive metabolic panel     Status: Abnormal   Collection Time: 05/21/21  9:06 AM  Result Value Ref Range   Glucose 176 (H) 70 - 99 mg/dL   BUN 14 8 - 27 mg/dL   Creatinine, Ser 1.12 (H) 0.57 - 1.00 mg/dL   eGFR 51 (L) >59 mL/min/1.73   BUN/Creatinine Ratio 13 12 - 28   Sodium 144 134 - 144 mmol/L   Potassium 4.8 3.5 - 5.2 mmol/L   Chloride 103 96 - 106 mmol/L   CO2 24 20 - 29 mmol/L   Calcium 8.9 8.7 - 10.3 mg/dL   Total Protein 7.0 6.0 - 8.5 g/dL   Albumin 4.4 3.7 - 4.7 g/dL   Globulin, Total  2.6 1.5 - 4.5 g/dL   Albumin/Globulin Ratio 1.7 1.2 - 2.2   Bilirubin Total 0.4 0.0 - 1.2 mg/dL   Alkaline Phosphatase 108 44 - 121 IU/L   AST 24 0 - 40 IU/L   ALT 28 0 - 32 IU/L  TSH     Status: None   Collection Time: 05/21/21  9:06 AM  Result Value Ref Range   TSH 2.160 0.450 - 4.500 uIU/mL  T4, free     Status: None   Collection Time: 05/21/21  9:06 AM  Result Value Ref Range   Free T4 1.57 0.82 - 1.77 ng/dL  HgB A1c     Status: Abnormal   Collection Time: 05/28/21 10:33 AM  Result Value Ref Range   Hemoglobin A1C     HbA1c POC (<> result, manual entry) 7.9 4.0 - 5.6 %   HbA1c, POC (prediabetic range)     HbA1c, POC (controlled diabetic range)       Assessment & Plan:   1) 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    She presents today with her logs, no meter, showing stable at goal fasting glycemic profile.  Her POCT A1c today is 7.9%, increasing slightly from last visit of 7.6%.  She denies any significant hypoglycemia. She does report more stress lately and finds herself snacking at times.  Her recent labs reviewed and shows stable kidney function.  -her diabetes is complicated by stage 3 renal insufficiency,obesity/sedentary life and she remains at a high risk for more acute and chronic complications which include CAD, CVA, CKD, retinopathy, and neuropathy. These are all discussed in detail with her.  - Nutritional counseling repeated at each appointment due to patients tendency to fall back in to old habits.  - The patient admits there is a room for improvement in their diet and drink choices. -  Suggestion is made for the patient to avoid simple carbohydrates from their diet including Cakes, Sweet Desserts / Pastries, Ice Cream, Soda (diet and regular), Sweet Tea, Candies, Chips, Cookies, Sweet Pastries, Store Bought Juices, Alcohol in Excess of 1-2 drinks a day, Artificial Sweeteners, Coffee Creamer, and "Sugar-free" Products. This will help patient to have stable blood glucose profile and potentially avoid unintended weight gain.   - I encouraged the patient to switch to unprocessed or minimally processed complex starch and increased protein intake (animal or plant source), fruits, and vegetables.   - Patient is advised to stick to a routine mealtimes to eat 3 meals a day and avoid unnecessary snacks (to snack only to correct hypoglycemia).  - I have approached her with the following individualized plan to manage diabetes and patient agrees:   -She is benefiting from reinitiation of low-dose Metformin and her kidney function is stable.  She is advised to continue with her Metformin 500 mg daily with breakfast as well as continuing her Glipizide 5 mg p.o. daily with breakfast .  -She  is advised to continue monitoring blood sugar at least once a day, and as needed if symptoms arise, and to call the clinic if she has readings less than 70 or greater than 200 for 3 tests in a row.  - she is not a candidate for SGLT2 inhibitors due to CKD.  2) BP/HTN:  Her blood pressure is controlled to target for her age.  She is not currently on any antihypertensive medications at  this time.  She does monitor BP at home routinely, and reports she typically has good results less than 140/90.    3) Hypothyroidism-long-term diagnosis, took levothyroxine for more than 10 years.  -Her previsit thyroid function tests are consistent with appropriate hormone replacement.  She is advised to continue Levothyroxine 75 mcg po daily before breakfast.   -She does not have recent TFTs to review.  Her Levothyroxine was increased to 75 mcg at last visit.  She is advised to continue current dose for now.  Will recheck TFTs prior to next visit and adjust dose if necessary.   - We discussed about the correct intake of her thyroid hormone, on empty stomach at fasting, with water, separated by at least 30 minutes from breakfast and other medications,  and separated by more than 4 hours from calcium, iron, multivitamins, acid reflux medications (PPIs). -Patient is made aware of the fact that thyroid hormone replacement is needed for life, dose to be adjusted by periodic monitoring of thyroid function tests.  4) Chronic Care/Health Maintenance: -she  is on Statin medications and  is encouraged to initiate and continue to follow up with Ophthalmology, Dentist,  Podiatrist at least yearly or according to recommendations, and advised to  stay away from smoking. I have recommended yearly flu vaccine and pneumonia vaccine at least every 5 years; moderate intensity exercise for up to 150 minutes weekly; and  sleep for at least 7 hours a day.  - I advised patient to maintain close follow up with Sherrilee Gilles, DO for primary  care needs.  -She stopped taking her Lipitor since last visit, says it was making her hair fall out.  Will recheck lipid panel prior to next visit.   I spent 30 minutes in the care of the patient today including review of labs from [place], Lipids, Thyroid Function, Hematology (current and previous including abstractions from other facilities); face-to-face time discussing  her blood glucose readings/logs, discussing hypoglycemia and hyperglycemia episodes and symptoms, medications doses, her options of short and long term treatment based on the latest standards of care / guidelines;  discussion about incorporating lifestyle medicine;  and documenting the encounter.    Please refer to Patient Instructions for Blood Glucose Monitoring and Insulin/Medications Dosing Guide"  in media tab for additional information. Please  also refer to " Patient Self Inventory" in the Media  tab for reviewed elements of pertinent patient history.  Carol Adams participated in the discussions, expressed understanding, and voiced agreement with the above plans.  All questions were answered to her satisfaction. she is encouraged to contact clinic should she have any questions or concerns prior to her return visit.   Follow up plan: - Return in about 6 months (around 11/25/2021) for Diabetes F/U with A1c in office, Thyroid follow up, Previsit labs, Bring meter and logs.  Carol Adams, [facility] [address] [place], [place] [address] Phone: [phone] Fax: [phone]   05/28/2021, 10:43 AM

## 2021-07-08 ENCOUNTER — Other Ambulatory Visit: Payer: Self-pay | Admitting: Nurse Practitioner

## 2021-07-08 ENCOUNTER — Other Ambulatory Visit: Payer: Self-pay | Admitting: "Endocrinology

## 2021-07-08 DIAGNOSIS — N1831 Chronic kidney disease, stage 3a: Secondary | ICD-10-CM

## 2021-07-08 DIAGNOSIS — E1122 Type 2 diabetes mellitus with diabetic chronic kidney disease: Secondary | ICD-10-CM

## 2021-08-13 ENCOUNTER — Emergency Department (HOSPITAL_COMMUNITY)
Admission: EM | Admit: 2021-08-13 | Discharge: 2021-08-13 | Disposition: A | Discharge status: Home / Self Care | Payer: Medicare Other | Attending: Emergency Medicine | Admitting: Emergency Medicine

## 2021-08-13 ENCOUNTER — Emergency Department (HOSPITAL_COMMUNITY): Payer: Medicare Other

## 2021-08-13 ENCOUNTER — Other Ambulatory Visit: Payer: Self-pay

## 2021-08-13 DIAGNOSIS — R1031 Right lower quadrant pain: Secondary | ICD-10-CM | POA: Diagnosis present

## 2021-08-13 DIAGNOSIS — R112 Nausea with vomiting, unspecified: Secondary | ICD-10-CM | POA: Insufficient documentation

## 2021-08-13 DIAGNOSIS — R944 Abnormal results of kidney function studies: Secondary | ICD-10-CM | POA: Diagnosis not present

## 2021-08-13 LAB — URINALYSIS, ROUTINE W REFLEX MICROSCOPIC
Bilirubin Urine: NEGATIVE
Glucose, UA: NEGATIVE mg/dL
Hgb urine dipstick: NEGATIVE
Ketones, ur: 5 mg/dL — AB
Leukocytes,Ua: NEGATIVE
Nitrite: NEGATIVE
Protein, ur: 30 mg/dL — AB
Specific Gravity, Urine: 1.028 (ref 1.005–1.030)
pH: 5 (ref 5.0–8.0)

## 2021-08-13 LAB — COMPREHENSIVE METABOLIC PANEL
ALT: 34 U/L (ref 0–44)
AST: 38 U/L (ref 15–41)
Albumin: 3.8 g/dL (ref 3.5–5.0)
Alkaline Phosphatase: 100 U/L (ref 38–126)
Anion gap: 11 (ref 5–15)
BUN: 22 mg/dL (ref 8–23)
CO2: 21 mmol/L — ABNORMAL LOW (ref 22–32)
Calcium: 8.7 mg/dL — ABNORMAL LOW (ref 8.9–10.3)
Chloride: 105 mmol/L (ref 98–111)
Creatinine, Ser: 1.98 mg/dL — ABNORMAL HIGH (ref 0.44–1.00)
GFR, Estimated: 26 mL/min — ABNORMAL LOW (ref >60)
Glucose, Bld: 79 mg/dL (ref 70–99)
Potassium: 4.2 mmol/L (ref 3.5–5.1)
Sodium: 137 mmol/L (ref 135–145)
Total Bilirubin: 1.1 mg/dL (ref 0.3–1.2)
Total Protein: 7.8 g/dL (ref 6.5–8.1)

## 2021-08-13 LAB — CBC WITH DIFFERENTIAL/PLATELET
Abs Immature Granulocytes: 0.05 10*3/uL (ref 0.00–0.07)
Basophils Absolute: 0.1 10*3/uL (ref 0.0–0.1)
Basophils Relative: 1 %
Eosinophils Absolute: 0.1 10*3/uL (ref 0.0–0.5)
Eosinophils Relative: 1 %
HCT: 36 % (ref 36.0–46.0)
Hemoglobin: 10.7 g/dL — ABNORMAL LOW (ref 12.0–15.0)
Immature Granulocytes: 1 %
Lymphocytes Relative: 13 %
Lymphs Abs: 1.4 10*3/uL (ref 0.7–4.0)
MCH: 23.9 pg — ABNORMAL LOW (ref 26.0–34.0)
MCHC: 29.7 g/dL — ABNORMAL LOW (ref 30.0–36.0)
MCV: 80.4 fL (ref 80.0–100.0)
Monocytes Absolute: 0.8 10*3/uL (ref 0.1–1.0)
Monocytes Relative: 8 %
Neutro Abs: 7.9 10*3/uL — ABNORMAL HIGH (ref 1.7–7.7)
Neutrophils Relative %: 76 %
Platelets: 284 10*3/uL (ref 150–400)
RBC: 4.48 MIL/uL (ref 3.87–5.11)
RDW: 15.3 % (ref 11.5–15.5)
WBC: 10.3 10*3/uL (ref 4.0–10.5)
nRBC: 0 % (ref 0.0–0.2)

## 2021-08-13 LAB — LIPASE, BLOOD: Lipase: 31 U/L (ref 11–51)

## 2021-08-13 MED ORDER — ONDANSETRON 4 MG PO TBDP: 4.0000 mg | ORAL_TABLET | Freq: Three times a day (TID) | ORAL | 0 refills | Status: DC | PRN

## 2021-08-13 MED ORDER — IOHEXOL 9 MG/ML PO SOLN
ORAL | Status: AC
  Filled 2021-08-13: qty 1000

## 2021-08-13 MED ORDER — HYDROCODONE-ACETAMINOPHEN 5-325 MG PO TABS: 2.0000 | ORAL_TABLET | Freq: Four times a day (QID) | ORAL | 0 refills | Status: DC | PRN

## 2021-08-13 MED ORDER — TAMSULOSIN HCL 0.4 MG PO CAPS: 0.4000 mg | ORAL_CAPSULE | Freq: Every day | ORAL | 0 refills | Status: DC

## 2021-08-13 NOTE — ED Triage Notes (Signed)
Right flank and groin pain that started Saturday. Denies blood in urine. Denies fever.

## 2021-08-13 NOTE — ED Provider Notes (Signed)
[facility] EMERGENCY DEPARTMENT Provider Note   CSN: [id number] Arrival date & time: 08/13/21  1552     History  Chief Complaint  Patient presents with   Flank Pain    Carol Adams is a 76 y.o. female who presents to the ED complaining of right lower quadrant pain onset 5 days.  Patient has had her gallbladder removed.  She still has her appendix.  Has not had any medications for his symptoms.  Has associated resolved nausea, vomiting.  Denies fever, chills, nausea, vomiting, dysuria, hematuria, vaginal bleeding, vaginal discharge.     The history is provided by the patient. No language interpreter was used.      Home Medications Prior to Admission medications   Medication Sig Start Date End Date Taking? Authorizing Provider  Accu-Chek FastClix Lancets MISC Use 1-2 times daily. E11.65 01/06/21   Cassandria Anger, MD  glipiZIDE (GLUCOTROL XL) 5 MG 24 hr tablet TAKE 1 TABLET BY MOUTH DAILY WITH BREAKFAST 07/08/21   Brita Romp, NP  glucose blood (ACCU-CHEK GUIDE) test strip USE TO TEST BLOOD GLUCOSE 1-2 TIMES DAILY 07/08/21   Brita Romp, NP  levothyroxine (SYNTHROID) 75 MCG tablet TAKE 1 TABLET BY MOUTH EVERY DAY BEFORE BREAKFAST 05/26/21   Brita Romp, NP  loratadine (CLARITIN) 10 MG tablet Take 10 mg by mouth daily.    [provider]  metFORMIN (GLUCOPHAGE-XR) 500 MG 24 hr tablet TAKE 1 TABLET BY MOUTH EVERY DAY WITH BREAKFAST 05/26/21   Brita Romp, NP  Multiple Vitamin (MULTIVITAMIN) capsule Take 1 capsule by mouth daily.    [provider]  omeprazole (PRILOSEC) 20 MG capsule TAKE 1 CAPSULE BY MOUTH EVERY DAY 07/08/21   Brita Romp, NP  PARoxetine (PAXIL) 20 MG tablet Take 20 mg by mouth daily.    [provider]  trandolapril (MAVIK) 4 MG tablet daily as needed. 03/30/18   [provider]      Allergies    Cephalexin    Review of Systems   Review of Systems  Constitutional:  Negative for chills and  fever.  Gastrointestinal:  Positive for abdominal pain, nausea and vomiting.  Genitourinary:  Positive for flank pain. Negative for dysuria, hematuria, vaginal bleeding and vaginal discharge.  Skin:  Negative for rash.  All other systems reviewed and are negative.  Physical Exam Updated Vital Signs BP (!) 184/81 (BP Location: Right Arm)    Pulse 91    Temp 98.4 F (36.9 C) (Oral)    Resp 17    Ht 5\' 5"  (1.651 m)    Wt 87.5 kg    SpO2 100%    BMI 32.12 kg/m  Physical Exam Vitals and nursing note reviewed.  Constitutional:      General: She is not in acute distress.    Appearance: She is not diaphoretic.  HENT:     Head: Normocephalic and atraumatic.     Mouth/Throat:     Pharynx: No oropharyngeal exudate.  Eyes:     General: No scleral icterus.    Conjunctiva/sclera: Conjunctivae normal.  Cardiovascular:     Rate and Rhythm: Normal rate and regular rhythm.     Pulses: Normal pulses.     Heart sounds: Normal heart sounds.  Pulmonary:     Effort: Pulmonary effort is normal. No respiratory distress.     Breath sounds: Normal breath sounds. No wheezing.  Abdominal:     General: Bowel sounds are normal.     Palpations: Abdomen  is soft. There is no mass.     Tenderness: There is abdominal tenderness in the right lower quadrant. There is no right CVA tenderness, left CVA tenderness, guarding or rebound. Positive signs include McBurney's sign. Negative signs include Rovsing's sign.     Comments: No CVA tenderness noted bilaterally.  Right lower quadrant tenderness to palpation.  Negative Rovsing sign.  Positive McBurney sign.  No overlying skin changes.  Musculoskeletal:        General: Normal range of motion.     Cervical back: Normal range of motion and neck supple.  Skin:    General: Skin is warm and dry.  Neurological:     Mental Status: She is alert.  Psychiatric:        Behavior: Behavior normal.    ED Results / Procedures / Treatments   Labs (all labs ordered are listed,  but only abnormal results are displayed) Labs Reviewed  URINALYSIS, ROUTINE W REFLEX MICROSCOPIC - Abnormal; Notable for the following components:      Result Value   APPearance HAZY (*)    Ketones, ur 5 (*)    Protein, ur 30 (*)    Bacteria, UA RARE (*)    All other components within normal limits  CBC WITH DIFFERENTIAL/PLATELET - Abnormal; Notable for the following components:   Hemoglobin 10.7 (*)    MCH 23.9 (*)    MCHC 29.7 (*)    Neutro Abs 7.9 (*)    All other components within normal limits  COMPREHENSIVE METABOLIC PANEL - Abnormal; Notable for the following components:   CO2 21 (*)    Creatinine, Ser 1.98 (*)    Calcium 8.7 (*)    GFR, Estimated 26 (*)    All other components within normal limits  LIPASE, BLOOD    EKG None  Radiology No results found.  Procedures Procedures    Medications Ordered in ED Medications - No data to display  ED Course/ Medical Decision Making/ A&P                           Medical Decision Making Amount and/or Complexity of Data Reviewed Labs: ordered. Radiology: ordered.  Risk Prescription drug management.   Patient presents to the ED complaining of right lower quadrant abdominal pain onset 5 days.  Patient still has her appendix.  No hematuria, dysuria.  Vital signs stable, patient afebrile, not tachycardic or hypoxic.  On exam patient with right lower quadrant tenderness to palpation without overlying skin changes.  Negative Rovsing sign.  No CVA tenderness noted bilaterally.  Differential diagnosis includes appendicitis, ovarian torsion, kidney stone.   Labs:  I ordered, and personally interpreted labs.  The pertinent results include:  CBC without leukocytosis, hemoglobin at 10.7, however, stable from prior. Lipase unremarkable at 31 CMP with creatinine elevated at 1.98, slightly elevated from prior at 1.12, GFR at 26, no recent priors to compare. Urinalysis without nitrates or leukocytosis.  Imaging: I ordered  imaging studies including CT abdomen pelvis ordered with results pending at time of signout.    Patient case discussed with Alyse Low, PA-C at sign-out. Plan at sign-out is pending CT scan and labs. Pt can likely be discharged with primary care follow up if no abnormalities noted. Patient care transferred at sign out.   This chart was dictated using voice recognition software, Dragon. Despite the best efforts of this provider to proofread and correct errors, errors may still occur which can change documentation  meaning.  Final Clinical Impression(s) / ED Diagnoses Final diagnoses:  Right lower quadrant pain    Rx / DC Orders ED Discharge Orders     None         Dyer Klug A, PA-C 08/13/21 2309    Lajean Saver, MD 08/17/21 1505

## 2021-08-13 NOTE — ED Provider Notes (Signed)
Pt's care assumed at 7pm.  Ct abdomen for rlq pain pending.   Pt's ct scan shows 47mm right UVJ stone.  Pt reevaluated.  Pt denies any current pain.  Pt advised to follow up with Urology.  Pt given rx for zofran, hydrocodone and flomax     Osie Cheeks 08/13/21 2232    Cathren Laine, MD 08/17/21 1504

## 2021-08-23 ENCOUNTER — Other Ambulatory Visit: Payer: Self-pay | Admitting: Nurse Practitioner

## 2021-10-06 ENCOUNTER — Other Ambulatory Visit: Payer: Self-pay | Admitting: Nurse Practitioner

## 2021-11-05 ENCOUNTER — Other Ambulatory Visit: Payer: Self-pay | Admitting: Nurse Practitioner

## 2021-11-19 LAB — COMPREHENSIVE METABOLIC PANEL
ALT: 25 IU/L (ref 0–32)
AST: 23 IU/L (ref 0–40)
Albumin/Globulin Ratio: 1.5 (ref 1.2–2.2)
Albumin: 4.3 g/dL (ref 3.7–4.7)
Alkaline Phosphatase: 120 IU/L (ref 44–121)
BUN/Creatinine Ratio: 12 (ref 12–28)
BUN: 14 mg/dL (ref 8–27)
Bilirubin Total: 0.4 mg/dL (ref 0.0–1.2)
CO2: 22 mmol/L (ref 20–29)
Calcium: 9 mg/dL (ref 8.7–10.3)
Chloride: 102 mmol/L (ref 96–106)
Creatinine, Ser: 1.15 mg/dL — ABNORMAL HIGH (ref 0.57–1.00)
Globulin, Total: 2.9 g/dL (ref 1.5–4.5)
Glucose: 169 mg/dL — ABNORMAL HIGH (ref 70–99)
Potassium: 4.4 mmol/L (ref 3.5–5.2)
Sodium: 144 mmol/L (ref 134–144)
Total Protein: 7.2 g/dL (ref 6.0–8.5)
eGFR: 49 mL/min/{1.73_m2} — ABNORMAL LOW (ref >59)

## 2021-11-19 LAB — LIPID PANEL
Chol/HDL Ratio: 4.4 ratio (ref 0.0–4.4)
Cholesterol, Total: 211 mg/dL — ABNORMAL HIGH (ref 100–199)
HDL: 48 mg/dL (ref >39)
LDL Chol Calc (NIH): 136 mg/dL — ABNORMAL HIGH (ref 0–99)
Triglycerides: 151 mg/dL — ABNORMAL HIGH (ref 0–149)
VLDL Cholesterol Cal: 27 mg/dL (ref 5–40)

## 2021-11-19 LAB — TSH: TSH: 3.48 u[IU]/mL (ref 0.450–4.500)

## 2021-11-19 LAB — T4, FREE: Free T4: 1.23 ng/dL (ref 0.82–1.77)

## 2021-11-25 ENCOUNTER — Ambulatory Visit (INDEPENDENT_AMBULATORY_CARE_PROVIDER_SITE_OTHER): Payer: Medicare Other | Admitting: Nurse Practitioner

## 2021-11-25 ENCOUNTER — Encounter: Payer: Self-pay | Admitting: Nurse Practitioner

## 2021-11-25 VITALS — BP 157/81 | HR 86 | Ht 64.0 in | Wt 196.0 lb

## 2021-11-25 DIAGNOSIS — E1122 Type 2 diabetes mellitus with diabetic chronic kidney disease: Secondary | ICD-10-CM

## 2021-11-25 DIAGNOSIS — E039 Hypothyroidism, unspecified: Secondary | ICD-10-CM

## 2021-11-25 DIAGNOSIS — E782 Mixed hyperlipidemia: Secondary | ICD-10-CM | POA: Diagnosis not present

## 2021-11-25 DIAGNOSIS — I10 Essential (primary) hypertension: Secondary | ICD-10-CM

## 2021-11-25 DIAGNOSIS — N1831 Chronic kidney disease, stage 3a: Secondary | ICD-10-CM

## 2021-11-25 DIAGNOSIS — E559 Vitamin D deficiency, unspecified: Secondary | ICD-10-CM

## 2021-11-25 LAB — POCT UA - MICROALBUMIN: Microalbumin Ur, POC: 80 mg/L

## 2021-11-25 LAB — POCT GLYCOSYLATED HEMOGLOBIN (HGB A1C): HbA1c POC (<> result, manual entry): 8.2 % (ref 4.0–5.6)

## 2021-11-25 NOTE — Patient Instructions (Signed)
Diabetes Mellitus and Foot Care Foot care is an important part of your health, especially when you have diabetes. Diabetes may cause you to have problems because of poor blood flow (circulation) to your feet and legs, which can cause your skin to: Become thinner and drier. Break more easily. Heal more slowly. Peel and crack. You may also have nerve damage (neuropathy) in your legs and feet, causing decreased feeling in them. This means that you may not notice minor injuries to your feet that could lead to more serious problems. Noticing and addressing any potential problems early is the best way to prevent future foot problems. How to care for your feet Foot hygiene  Wash your feet daily with warm water and mild soap. Do not use hot water. Then, pat your feet and the areas between your toes until they are completely dry. Do not soak your feet as this can dry your skin. Trim your toenails straight across. Do not dig under them or around the cuticle. File the edges of your nails with an emery board or nail file. Apply a moisturizing lotion or petroleum jelly to the skin on your feet and to dry, brittle toenails. Use lotion that does not contain alcohol and is unscented. Do not apply lotion between your toes. Shoes and socks Wear clean socks or stockings every day. Make sure they are not too tight. Do not wear knee-high stockings since they may decrease blood flow to your legs. Wear shoes that fit properly and have enough cushioning. Always look in your shoes before you put them on to be sure there are no objects inside. To break in new shoes, wear them for just a few hours a day. This prevents injuries on your feet. Wounds, scrapes, corns, and calluses  Check your feet daily for blisters, cuts, bruises, sores, and redness. If you cannot see the bottom of your feet, use a mirror or ask someone for help. Do not cut corns or calluses or try to remove them with medicine. If you find a minor scrape,  cut, or break in the skin on your feet, keep it and the skin around it clean and dry. You may clean these areas with mild soap and water. Do not clean the area with peroxide, alcohol, or iodine. If you have a wound, scrape, corn, or callus on your foot, look at it several times a day to make sure it is healing and not infected. Check for: Redness, swelling, or pain. Fluid or blood. Warmth. Pus or a bad smell. General tips Do not cross your legs. This may decrease blood flow to your feet. Do not use heating pads or hot water bottles on your feet. They may burn your skin. If you have lost feeling in your feet or legs, you may not know this is happening until it is too late. Protect your feet from hot and cold by wearing shoes, such as at the beach or on hot pavement. Schedule a complete foot exam at least once a year (annually) or more often if you have foot problems. Report any cuts, sores, or bruises to your health care provider immediately. Where to find more information American Diabetes Association: www.diabetes.org Association of Diabetes Care & Education Specialists: www.diabeteseducator.org Contact a health care provider if: You have a medical condition that increases your risk of infection and you have any cuts, sores, or bruises on your feet. You have an injury that is not healing. You have redness on your legs or feet. You   feel burning or tingling in your legs or feet. You have pain or cramps in your legs and feet. Your legs or feet are numb. Your feet always feel cold. You have pain around any toenails. Get help right away if: You have a wound, scrape, corn, or callus on your foot and: You have pain, swelling, or redness that gets worse. You have fluid or blood coming from the wound, scrape, corn, or callus. Your wound, scrape, corn, or callus feels warm to the touch. You have pus or a bad smell coming from the wound, scrape, corn, or callus. You have a fever. You have a red  line going up your leg. Summary Check your feet every day for blisters, cuts, bruises, sores, and redness. Apply a moisturizing lotion or petroleum jelly to the skin on your feet and to dry, brittle toenails. Wear shoes that fit properly and have enough cushioning. If you have foot problems, report any cuts, sores, or bruises to your health care provider immediately. Schedule a complete foot exam at least once a year (annually) or more often if you have foot problems. This information is not intended to replace advice given to you by your health care provider. Make sure you discuss any questions you have with your health care provider. Document Revised: 01/04/2020 Document Reviewed: 01/04/2020 Elsevier Patient Education  2023 [employer] Inc.  

## 2021-11-25 NOTE — Progress Notes (Signed)
11/25/2021, 11:05 AM          Endocrinology follow-up note   Subjective:    Patient ID: Carol Adams, female    DOB: [birth year].  Carol Adams is being seen in follow-up for management of currently uncontrolled symptomatic type 2 diabetes, hypothyroidism, hyperlipidemia, hypertension.  She is accompanied by her husband today.  PMD:   Sherrilee Gilles, DO.   Past Medical History:  Diagnosis Date   Diabetes mellitus, type II ([place])    Hypothyroidism    Past Surgical History:  Procedure Laterality Date   ABDOMINAL HYSTERECTOMY     CHOLECYSTECTOMY     Social History   Socioeconomic History   Marital status: Married    Spouse name: Not on file   Number of children: Not on file   Years of education: Not on file   Highest education level: Not on file  Occupational History   Not on file  Tobacco Use   Smoking status: Never   Smokeless tobacco: Never  Vaping Use   Vaping Use: Never used  Substance and Sexual Activity   Alcohol use: Never   Drug use: Never   Sexual activity: Not on file  Other Topics Concern   Not on file  Social History Narrative   Not on file   Social Determinants of Health   Financial Resource Strain: Not on file  Food Insecurity: Not on file  Transportation Needs: Not on file  Physical Activity: Not on file  Stress: Not on file  Social Connections: Not on file   Outpatient Encounter Medications as of 11/25/2021  Medication Sig   cholecalciferol (VITAMIN D3) 25 MCG (1000 UNIT) tablet Take 5,000 Units by mouth daily.   Accu-Chek FastClix Lancets MISC Use 1-2 times daily. E11.65   glipiZIDE (GLUCOTROL XL) 5 MG 24 hr tablet TAKE 1 TABLET BY MOUTH DAILY WITH BREAKFAST   glucose blood (ACCU-CHEK GUIDE) test strip USE TO TEST BLOOD GLUCOSE 1-2 TIMES DAILY   levothyroxine (SYNTHROID) 75 MCG tablet TAKE 1 TABLET BY MOUTH EVERY DAY BEFORE BREAKFAST   loratadine (CLARITIN) 10 MG tablet Take 10 mg by mouth daily.   metFORMIN  (GLUCOPHAGE-XR) 500 MG 24 hr tablet TAKE 1 TABLET BY MOUTH EVERY DAY WITH BREAKFAST   Multiple Vitamin (MULTIVITAMIN) capsule Take 1 capsule by mouth daily.   omeprazole (PRILOSEC) 20 MG capsule TAKE 1 CAPSULE BY MOUTH EVERY DAY   PARoxetine (PAXIL) 20 MG tablet Take 20 mg by mouth daily.   trandolapril (MAVIK) 4 MG tablet daily as needed.   [DISCONTINUED] HYDROcodone-acetaminophen (NORCO/VICODIN) 5-325 MG tablet Take 2 tablets by mouth every 6 (six) hours as needed for moderate pain.   [DISCONTINUED] ondansetron (ZOFRAN-ODT) 4 MG disintegrating tablet Take 1 tablet (4 mg total) by mouth every 8 (eight) hours as needed for nausea or vomiting.   [DISCONTINUED] tamsulosin (FLOMAX) 0.4 MG CAPS capsule Take 1 capsule (0.4 mg total) by mouth daily.   No facility-administered encounter medications on file as of 11/25/2021.    ALLERGIES: Allergies  Allergen Reactions   Cephalexin Rash    VACCINATION STATUS: Immunization History  Administered Date(s) Administered   PFIZER Comirnaty(Gray Top)Covid-19 Tri-Sucrose Vaccine 09/16/2019, 10/07/2019    Diabetes She presents for her follow-up diabetic visit. She has type 2 diabetes mellitus. The initial diagnosis of diabetes was made 2 years (She was recently diagnosed with type 2 diabetes at age 18 years.) ago. Her disease course has been improving. There are no hypoglycemic associated  symptoms. Pertinent negatives for hypoglycemia include no confusion, headaches, pallor or seizures. There are no diabetic associated symptoms. Pertinent negatives for diabetes include no polydipsia, no polyphagia and no polyuria. There are no hypoglycemic complications. Symptoms are stable. Diabetic complications include nephropathy. Risk factors for coronary artery disease include diabetes mellitus, dyslipidemia, hypertension, obesity, sedentary lifestyle and post-menopausal. Current diabetic treatment includes oral agent (dual therapy). She is compliant with treatment all of  the time. Her weight is fluctuating minimally. She is following a diabetic diet. When asked about meal planning, she reported none. She has not had a previous visit with a dietitian. She participates in exercise intermittently. Her home blood glucose trend is decreasing steadily. Her breakfast blood glucose range is generally 130-140 mg/dl. Her overall blood glucose range is 140-180 mg/dl. (She presents today, accompanied by her husband, with her meter and logs showing stable, improving fasting glycemic profile.  Her POCT A1c today is 8.2%, increasing from last visit of 7.9%.  She says she has been more stressed since last visit with her adult son moving back in with her temporarily, but he has since moved out and is getting back on her normal routine.  Analysis of her meter shows 7-day average of 147, 14-day average of 151, 30-day average of 156, 90-day average of 157.  She denies any hypoglycemia. ) An ACE inhibitor/angiotensin II receptor blocker is not being taken. She does not see a podiatrist.Eye exam is current.  Hypertension This is a chronic problem. The current episode started more than 1 year ago. The problem has been waxing and waning since onset. The problem is uncontrolled. Pertinent negatives include no headaches or palpitations. Agents associated with hypertension include thyroid hormones. Risk factors for coronary artery disease include diabetes mellitus, dyslipidemia, sedentary lifestyle, obesity and post-menopausal state. Past treatments include nothing. Compliance problems include diet and exercise.  Hypertensive end-organ damage includes kidney disease. Identifiable causes of hypertension include a thyroid problem.    Review of systems  Constitutional: + Minimally fluctuating body weight,  current Body mass index is 33.64 kg/m. , no fatigue, no subjective hyperthermia, no subjective hypothermia Eyes: no blurry vision, no xerophthalmia ENT: no sore throat, no nodules palpated in  throat, no dysphagia/odynophagia, no hoarseness Cardiovascular: no chest pain, no shortness of breath, no palpitations, no leg swelling Respiratory: no cough, no shortness of breath Gastrointestinal: no nausea/vomiting/diarrhea Musculoskeletal: no muscle/joint aches Skin: no rashes, no hyperemia Neurological: no tremors, no numbness, no tingling, no dizziness Psychiatric: no depression, no anxiety   Objective:    BP (!) 157/81   Pulse 86   Ht 5' 4"  (1.626 m)   Wt 196 lb (88.9 kg)   BMI 33.64 kg/m   Wt Readings from Last 3 Encounters:  11/25/21 196 lb (88.9 kg)  08/13/21 193 lb (87.5 kg)  05/28/21 200 lb (90.7 kg)    BP Readings from Last 3 Encounters:  11/25/21 (!) 157/81  08/13/21 (!) 189/75  05/28/21 (!) 153/73    Physical Exam- Limited  Constitutional:  Body mass index is 33.64 kg/m. , not in acute distress, normal state of mind Eyes:  EOMI, no exophthalmos Neck: Supple Cardiovascular: RRR, no murmurs, rubs, or gallops, no edema Respiratory: Adequate breathing efforts, no crackles, rales, rhonchi, or wheezing Musculoskeletal: no gross deformities, strength intact in all four extremities, no gross restriction of joint movements Skin:  no rashes, no hyperemia Neurological: no tremor with outstretched hands   Diabetic Foot Exam - Simple   Simple Foot Form Diabetic Foot exam  was performed with the following findings: Yes 11/25/2021 11:00 AM  Visual Inspection See comments: Yes Sensation Testing Intact to touch and monofilament testing bilaterally: Yes Pulse Check Posterior Tibialis and Dorsalis pulse intact bilaterally: Yes Comments Bilateral distal toes pale and cool to touch     Recent Results (from the past 2160 hour(s))  Comprehensive metabolic panel     Status: Abnormal   Collection Time: 11/18/21  8:43 AM  Result Value Ref Range   Glucose 169 (H) 70 - 99 mg/dL   BUN 14 8 - 27 mg/dL   Creatinine, Ser 1.15 (H) 0.57 - 1.00 mg/dL   eGFR 49 (L) >59  mL/min/1.73   BUN/Creatinine Ratio 12 12 - 28   Sodium 144 134 - 144 mmol/L   Potassium 4.4 3.5 - 5.2 mmol/L   Chloride 102 96 - 106 mmol/L   CO2 22 20 - 29 mmol/L   Calcium 9.0 8.7 - 10.3 mg/dL   Total Protein 7.2 6.0 - 8.5 g/dL   Albumin 4.3 3.7 - 4.7 g/dL   Globulin, Total 2.9 1.5 - 4.5 g/dL   Albumin/Globulin Ratio 1.5 1.2 - 2.2   Bilirubin Total 0.4 0.0 - 1.2 mg/dL   Alkaline Phosphatase 120 44 - 121 IU/L   AST 23 0 - 40 IU/L   ALT 25 0 - 32 IU/L  Lipid panel     Status: Abnormal   Collection Time: 11/18/21  8:43 AM  Result Value Ref Range   Cholesterol, Total 211 (H) 100 - 199 mg/dL   Triglycerides 151 (H) 0 - 149 mg/dL   HDL 48 >39 mg/dL   VLDL Cholesterol Cal 27 5 - 40 mg/dL   LDL Chol Calc (NIH) 136 (H) 0 - 99 mg/dL   Chol/HDL Ratio 4.4 0.0 - 4.4 ratio    Comment:                                   T. Chol/HDL Ratio                                             Men  Women                               1/2 Avg.Risk  3.4    3.3                                   Avg.Risk  5.0    4.4                                2X Avg.Risk  9.6    7.1                                3X Avg.Risk 23.4   11.0   TSH     Status: None   Collection Time: 11/18/21  8:43 AM  Result Value Ref Range   TSH 3.480 0.450 - 4.500 uIU/mL  T4, free     Status: None   Collection Time: 11/18/21  8:43 AM  Result  Value Ref Range   Free T4 1.23 0.82 - 1.77 ng/dL  HgB A1c     Status: Abnormal   Collection Time: 11/25/21 10:45 AM  Result Value Ref Range   Hemoglobin A1C     HbA1c POC (<> result, manual entry) 8.2 4.0 - 5.6 %   HbA1c, POC (prediabetic range)     HbA1c, POC (controlled diabetic range)       Assessment & Plan:   1) 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    She presents today, accompanied by her husband, with her meter and logs showing stable, improving fasting glycemic  profile.  Her POCT A1c today is 8.2%, increasing from last visit of 7.9%.  She says she has been more stressed since last visit with her adult son moving back in with her temporarily, but he has since moved out and is getting back on her normal routine.  Analysis of her meter shows 7-day average of 147, 14-day average of 151, 30-day average of 156, 90-day average of 157.  She denies any hypoglycemia.  Her recent labs reviewed and shows stable kidney function.  -her diabetes is complicated by stage 3 renal insufficiency,obesity/sedentary life and she remains at a high risk for more acute and chronic complications which include CAD, CVA, CKD, retinopathy, and neuropathy. These are all discussed in detail with her.  - Nutritional counseling repeated at each appointment due to patients tendency to fall back in to old habits.  - The patient admits there is a room for improvement in their diet and drink choices. -  Suggestion is made for the patient to avoid simple carbohydrates from their diet including Cakes, Sweet Desserts / Pastries, Ice Cream, Soda (diet and regular), Sweet Tea, Candies, Chips, Cookies, Sweet Pastries, Store Bought Juices, Alcohol in Excess of 1-2 drinks a day, Artificial Sweeteners, Coffee Creamer, and "Sugar-free" Products. This will help patient to have stable blood glucose profile and potentially avoid unintended weight gain.   - I encouraged the patient to switch to unprocessed or minimally processed complex starch and increased protein intake (animal or plant source), fruits, and vegetables.   - Patient is advised to stick to a routine mealtimes to eat 3 meals a day and avoid unnecessary snacks (to snack only to correct hypoglycemia).  - I have approached her with the following individualized plan to manage diabetes and patient agrees:   -She is benefiting from reinitiation of low-dose Metformin and her kidney function is stable.  She is advised to continue with her Metformin  500 mg daily with breakfast as well as continuing her Glipizide 5 mg p.o. daily with breakfast .  -She is advised to continue monitoring blood sugar at least once a day, and as needed if symptoms arise, and to call the clinic if she has readings less than 70 or greater than 200 for 3 tests in a row.  - she is not a candidate for SGLT2 inhibitors due to CKD.  2) BP/HTN:  Her blood pressure is controlled to target for her age.  She is not currently on any antihypertensive medications at this time.  She does monitor BP at home routinely, and reports she typically has good results less than 140/90.    3) Hyperlipidemia Her most recent lipid panel from 11/18/21 shows uncontrolled LDL of 136.  She stopped taking her Lipitor as it was causing her to  lose her hair.  She prefers to try diet and exercise prior to restarting it.  4) Hypothyroidism-long-term diagnosis, took levothyroxine for more than 10 years.   -Her previsit thyroid function tests are consistent with appropriate hormone replacement.  She is advised to continue Levothyroxine 75 mcg po daily before breakfast.  -She does not have recent TFTs to review.  Her Levothyroxine was increased to 75 mcg at last visit.  She is advised to continue current dose for now.  Will recheck TFTs prior to next visit and adjust dose if necessary.   - We discussed about the correct intake of her thyroid hormone, on empty stomach at fasting, with water, separated by at least 30 minutes from breakfast and other medications,  and separated by more than 4 hours from calcium, iron, multivitamins, acid reflux medications (PPIs). -Patient is made aware of the fact that thyroid hormone replacement is needed for life, dose to be adjusted by periodic monitoring of thyroid function tests.  5) Chronic Care/Health Maintenance: -she is encouraged to initiate and continue to follow up with Ophthalmology, Dentist,  Podiatrist at least yearly or according to recommendations, and  advised to  stay away from smoking. I have recommended yearly flu vaccine and pneumonia vaccine at least every 5 years; moderate intensity exercise for up to 150 minutes weekly; and  sleep for at least 7 hours a day.  - I advised patient to maintain close follow up with Sherrilee Gilles, DO for primary care needs.  -She stopped taking her Lipitor since last visit, says it was making her hair fall out.  Will recheck lipid panel prior to next visit.     I spent 42 minutes in the care of the patient today including review of labs from [place], Lipids, Thyroid Function, Hematology (current and previous including abstractions from other facilities); face-to-face time discussing  her blood glucose readings/logs, discussing hypoglycemia and hyperglycemia episodes and symptoms, medications doses, her options of short and long term treatment based on the latest standards of care / guidelines;  discussion about incorporating lifestyle medicine;  and documenting the encounter.    Please refer to Patient Instructions for Blood Glucose Monitoring and Insulin/Medications Dosing Guide"  in media tab for additional information. Please  also refer to " Patient Self Inventory" in the Media  tab for reviewed elements of pertinent patient history.  Candise Bowens participated in the discussions, expressed understanding, and voiced agreement with the above plans.  All questions were answered to her satisfaction. she is encouraged to contact clinic should she have any questions or concerns prior to her return visit.   Follow up plan: - Return in about 4 months (around 03/28/2022) for Diabetes F/U with A1c in office, Thyroid follow up, Previsit labs, Bring meter and logs.  Rayetta Pigg, [facility] [address] [place], [place] [address] Phone: [phone] Fax: [phone]   11/25/2021, 11:05 AM

## 2021-12-23 ENCOUNTER — Other Ambulatory Visit: Payer: Self-pay | Admitting: "Endocrinology

## 2021-12-23 ENCOUNTER — Other Ambulatory Visit: Payer: Self-pay | Admitting: Nurse Practitioner

## 2021-12-23 DIAGNOSIS — N1831 Chronic kidney disease, stage 3a: Secondary | ICD-10-CM

## 2022-03-24 ENCOUNTER — Other Ambulatory Visit: Payer: Self-pay | Admitting: Nurse Practitioner

## 2022-03-25 LAB — COMPREHENSIVE METABOLIC PANEL
ALT: 32 IU/L (ref 0–32)
AST: 27 IU/L (ref 0–40)
Albumin/Globulin Ratio: 1.6 (ref 1.2–2.2)
Albumin: 4.6 g/dL (ref 3.8–4.8)
Alkaline Phosphatase: 119 IU/L (ref 44–121)
BUN/Creatinine Ratio: 12 (ref 12–28)
BUN: 16 mg/dL (ref 8–27)
Bilirubin Total: 0.5 mg/dL (ref 0.0–1.2)
CO2: 21 mmol/L (ref 20–29)
Calcium: 9.4 mg/dL (ref 8.7–10.3)
Chloride: 104 mmol/L (ref 96–106)
Creatinine, Ser: 1.31 mg/dL — ABNORMAL HIGH (ref 0.57–1.00)
Globulin, Total: 2.8 g/dL (ref 1.5–4.5)
Glucose: 187 mg/dL — ABNORMAL HIGH (ref 70–99)
Potassium: 5 mmol/L (ref 3.5–5.2)
Sodium: 143 mmol/L (ref 134–144)
Total Protein: 7.4 g/dL (ref 6.0–8.5)
eGFR: 42 mL/min/{1.73_m2} — ABNORMAL LOW (ref >59)

## 2022-03-25 LAB — T4, FREE: Free T4: 1.57 ng/dL (ref 0.82–1.77)

## 2022-03-25 LAB — VITAMIN D 25 HYDROXY (VIT D DEFICIENCY, FRACTURES): Vit D, 25-Hydroxy: 53.9 ng/mL (ref 30.0–100.0)

## 2022-03-25 LAB — TSH: TSH: 2.46 u[IU]/mL (ref 0.450–4.500)

## 2022-03-31 ENCOUNTER — Encounter: Payer: Self-pay | Admitting: Nurse Practitioner

## 2022-03-31 ENCOUNTER — Ambulatory Visit (INDEPENDENT_AMBULATORY_CARE_PROVIDER_SITE_OTHER): Payer: Medicare Other | Admitting: Nurse Practitioner

## 2022-03-31 VITALS — BP 126/66 | HR 102 | Ht 64.0 in | Wt 198.4 lb

## 2022-03-31 DIAGNOSIS — E039 Hypothyroidism, unspecified: Secondary | ICD-10-CM

## 2022-03-31 DIAGNOSIS — E782 Mixed hyperlipidemia: Secondary | ICD-10-CM

## 2022-03-31 DIAGNOSIS — E1122 Type 2 diabetes mellitus with diabetic chronic kidney disease: Secondary | ICD-10-CM

## 2022-03-31 DIAGNOSIS — I10 Essential (primary) hypertension: Secondary | ICD-10-CM

## 2022-03-31 DIAGNOSIS — N1832 Chronic kidney disease, stage 3b: Secondary | ICD-10-CM | POA: Diagnosis not present

## 2022-03-31 DIAGNOSIS — E559 Vitamin D deficiency, unspecified: Secondary | ICD-10-CM

## 2022-03-31 DIAGNOSIS — N1831 Chronic kidney disease, stage 3a: Secondary | ICD-10-CM

## 2022-03-31 LAB — POCT GLYCOSYLATED HEMOGLOBIN (HGB A1C): Hemoglobin A1C: 7.7 % — AB (ref 4.0–5.6)

## 2022-03-31 MED ORDER — GLIPIZIDE ER 5 MG PO TB24: 5.0000 mg | ORAL_TABLET | Freq: Every day | ORAL | 3 refills | Status: DC

## 2022-03-31 MED ORDER — ACCU-CHEK FASTCLIX LANCETS MISC: 3 refills | Status: DC

## 2022-03-31 MED ORDER — METFORMIN HCL ER 500 MG PO TB24: 500.0000 mg | ORAL_TABLET | Freq: Every day | ORAL | 1 refills | Status: DC

## 2022-03-31 MED ORDER — ACCU-CHEK GUIDE VI STRP: ORAL_STRIP | 3 refills | Status: DC

## 2022-03-31 MED ORDER — LEVOTHYROXINE SODIUM 75 MCG PO TABS: 75.0000 ug | ORAL_TABLET | Freq: Every day | ORAL | 3 refills | Status: DC

## 2022-03-31 NOTE — Patient Instructions (Signed)

## 2022-03-31 NOTE — Progress Notes (Signed)
03/31/2022, 10:41 AM          Endocrinology follow-up note   Subjective:    Patient ID: Carol Adams, female    DOB: 1946/06/05.  Carol Adams is being seen in follow-up for management of currently uncontrolled symptomatic type 2 diabetes, hypothyroidism, hyperlipidemia, hypertension.  She is accompanied by her husband today.  PMD:   Sherrilee Gilles, DO.   Past Medical History:  Diagnosis Date   Diabetes mellitus, type II ([place])    Hypothyroidism    Past Surgical History:  Procedure Laterality Date   ABDOMINAL HYSTERECTOMY     CHOLECYSTECTOMY     Social History   Socioeconomic History   Marital status: Married    Spouse name: Not on file   Number of children: Not on file   Years of education: Not on file   Highest education level: Not on file  Occupational History   Not on file  Tobacco Use   Smoking status: Never   Smokeless tobacco: Never  Vaping Use   Vaping Use: Never used  Substance and Sexual Activity   Alcohol use: Never   Drug use: Never   Sexual activity: Not on file  Other Topics Concern   Not on file  Social History Narrative   Not on file   Social Determinants of Health   Financial Resource Strain: Not on file  Food Insecurity: Not on file  Transportation Needs: Not on file  Physical Activity: Not on file  Stress: Not on file  Social Connections: Not on file   Outpatient Encounter Medications as of 03/31/2022  Medication Sig   cholecalciferol (VITAMIN D3) 25 MCG (1000 UNIT) tablet Take 5,000 Units by mouth daily.   loratadine (CLARITIN) 10 MG tablet Take 10 mg by mouth daily.   Multiple Vitamin (MULTIVITAMIN) capsule Take 1 capsule by mouth daily.   omeprazole (PRILOSEC) 20 MG capsule TAKE 1 CAPSULE BY MOUTH EVERY DAY   PARoxetine (PAXIL) 20 MG tablet Take 20 mg by mouth daily.   trandolapril (MAVIK) 4 MG tablet daily as needed.   [DISCONTINUED] Accu-Chek FastClix Lancets MISC USE 1 TO 2 TIMES DAILY   [DISCONTINUED]  glipiZIDE (GLUCOTROL XL) 5 MG 24 hr tablet TAKE 1 TABLET BY MOUTH DAILY WITH BREAKFAST   [DISCONTINUED] glucose blood (ACCU-CHEK GUIDE) test strip USE TO TEST BLOOD GLUCOSE 1 TO 2 TIMES DAILY AS DIRECTED   [DISCONTINUED] levothyroxine (SYNTHROID) 75 MCG tablet TAKE 1 TABLET BY MOUTH EVERY DAY BEFORE BREAKFAST   [DISCONTINUED] metFORMIN (GLUCOPHAGE-XR) 500 MG 24 hr tablet TAKE 1 TABLET BY MOUTH EVERY DAY WITH BREAKFAST   Accu-Chek FastClix Lancets MISC USE 1 TO 2 TIMES DAILY   glipiZIDE (GLUCOTROL XL) 5 MG 24 hr tablet Take 1 tablet (5 mg total) by mouth daily with breakfast.   glucose blood (ACCU-CHEK GUIDE) test strip USE TO TEST BLOOD GLUCOSE 1 TO 2 TIMES DAILY AS DIRECTED   levothyroxine (SYNTHROID) 75 MCG tablet Take 1 tablet (75 mcg total) by mouth daily before breakfast.   metFORMIN (GLUCOPHAGE-XR) 500 MG 24 hr tablet Take 1 tablet (500 mg total) by mouth daily with breakfast.   No facility-administered encounter medications on file as of 03/31/2022.    ALLERGIES: Allergies  Allergen Reactions   Cephalexin Rash    VACCINATION STATUS: Immunization History  Administered Date(s) Administered   PFIZER Comirnaty([place])Covid-19 Tri-Sucrose Vaccine 09/16/2019, 10/07/2019    Diabetes She presents for her follow-up diabetic visit. She has type 2 diabetes  mellitus. Onset time: She was recently diagnosed with type 2 diabetes at age 63 years. Her disease course has been improving. There are no hypoglycemic associated symptoms. Pertinent negatives for hypoglycemia include no confusion, headaches, pallor or seizures. There are no diabetic associated symptoms. Pertinent negatives for diabetes include no polydipsia, no polyphagia and no polyuria. There are no hypoglycemic complications. Symptoms are stable. Diabetic complications include nephropathy. Risk factors for coronary artery disease include diabetes mellitus, dyslipidemia, hypertension, obesity, sedentary lifestyle and post-menopausal.  Current diabetic treatment includes oral agent (dual therapy). She is compliant with treatment all of the time. Her weight is fluctuating minimally. She is following a diabetic diet. When asked about meal planning, she reported none. She has not had a previous visit with a dietitian. She participates in exercise intermittently. Her home blood glucose trend is decreasing steadily. Her breakfast blood glucose range is generally 140-180 mg/dl. Her overall blood glucose range is 140-180 mg/dl. (She presents today, accompanied by her husband, with her meter and logs showing stable, improving fasting glycemic profile.  Her POCT A1c today is 7.7%, improving from last visit of 8.2%.  She denies any hypoglycemia. ) An ACE inhibitor/angiotensin II receptor blocker is not being taken. She does not see a podiatrist.Eye exam is current.  Hypertension This is a chronic problem. The current episode started more than 1 year ago. The problem has been waxing and waning since onset. The problem is uncontrolled. Pertinent negatives include no headaches or palpitations. Agents associated with hypertension include thyroid hormones. Risk factors for coronary artery disease include diabetes mellitus, dyslipidemia, sedentary lifestyle, obesity and post-menopausal state. Past treatments include nothing. Compliance problems include diet and exercise.  Hypertensive end-organ damage includes kidney disease. Identifiable causes of hypertension include a thyroid problem.   Review of systems  Constitutional: + Minimally fluctuating body weight,  current Body mass index is 34.06 kg/m. , no fatigue, no subjective hyperthermia, no subjective hypothermia Eyes: no blurry vision, no xerophthalmia ENT: no sore throat, no nodules palpated in throat, no dysphagia/odynophagia, no hoarseness Cardiovascular: no chest pain, no shortness of breath, no palpitations, no leg swelling Respiratory: no cough, no shortness of breath Gastrointestinal: no  nausea/vomiting/diarrhea Musculoskeletal: no muscle/joint aches Skin: no rashes, no hyperemia Neurological: no tremors, no numbness, no tingling, no dizziness Psychiatric: no depression, no anxiety   Objective:    BP 126/66 (BP Location: Right Arm, Patient Position: Sitting, Cuff Size: Large)   Pulse (!) 102   Ht 5' 4"  (1.626 m)   Wt 198 lb 6.4 oz (90 kg)   BMI 34.06 kg/m   Wt Readings from Last 3 Encounters:  03/31/22 198 lb 6.4 oz (90 kg)  11/25/21 196 lb (88.9 kg)  08/13/21 193 lb (87.5 kg)    BP Readings from Last 3 Encounters:  03/31/22 126/66  11/25/21 (!) 157/81  08/13/21 (!) 189/75     Physical Exam- Limited  Constitutional:  Body mass index is 34.06 kg/m. , not in acute distress, normal state of mind Eyes:  EOMI, no exophthalmos Neck: Supple Cardiovascular: RRR, no murmurs, rubs, or gallops, no edema Respiratory: Adequate breathing efforts, no crackles, rales, rhonchi, or wheezing Musculoskeletal: no gross deformities, strength intact in all four extremities, no gross restriction of joint movements Skin:  no rashes, no hyperemia Neurological: no tremor with outstretched hands   Diabetic Foot Exam - Simple   No data filed     Recent Results (from the past 2160 hour(s))  Comprehensive metabolic panel     Status: Abnormal  Collection Time: 03/24/22  8:40 AM  Result Value Ref Range   Glucose 187 (H) 70 - 99 mg/dL   BUN 16 8 - 27 mg/dL   Creatinine, Ser 1.31 (H) 0.57 - 1.00 mg/dL   eGFR 42 (L) >59 mL/min/1.73   BUN/Creatinine Ratio 12 12 - 28   Sodium 143 134 - 144 mmol/L   Potassium 5.0 3.5 - 5.2 mmol/L   Chloride 104 96 - 106 mmol/L   CO2 21 20 - 29 mmol/L   Calcium 9.4 8.7 - 10.3 mg/dL   Total Protein 7.4 6.0 - 8.5 g/dL   Albumin 4.6 3.8 - 4.8 g/dL   Globulin, Total 2.8 1.5 - 4.5 g/dL   Albumin/Globulin Ratio 1.6 1.2 - 2.2   Bilirubin Total 0.5 0.0 - 1.2 mg/dL   Alkaline Phosphatase 119 44 - 121 IU/L   AST 27 0 - 40 IU/L   ALT 32 0 - 32 IU/L   TSH     Status: None   Collection Time: 03/24/22  8:40 AM  Result Value Ref Range   TSH 2.460 0.450 - 4.500 uIU/mL  T4, free     Status: None   Collection Time: 03/24/22  8:40 AM  Result Value Ref Range   Free T4 1.57 0.82 - 1.77 ng/dL  VITAMIN D 25 Hydroxy (Vit-D Deficiency, Fractures)     Status: None   Collection Time: 03/24/22  8:40 AM  Result Value Ref Range   Vit D, 25-Hydroxy 53.9 30.0 - 100.0 ng/mL    Comment: Vitamin D deficiency has been defined by the Institute of Medicine and an Endocrine Society practice guideline as a level of serum 25-OH vitamin D less than 20 ng/mL (1,2). The Endocrine Society went on to further define vitamin D insufficiency as a level between 21 and 29 ng/mL (2). 1. IOM (Institute of Medicine). 2010. Dietary reference    intakes for calcium and D. [place]: The    [employer]. 2. Holick MF, Binkley Harrison, Bischoff-Ferrari HA, et al.    Evaluation, treatment, and prevention of vitamin D    deficiency: an Endocrine Society clinical practice    guideline. JCEM. 2011 Jul; 96(7):1911-30.   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    She presents today, accompanied by her husband, with her meter and logs showing stable, improving fasting glycemic profile.  Her POCT A1c today is 7.7%, improving from last visit of 8.2%.  She denies any hypoglycemia.  Her recent labs reviewed and shows stable kidney function.  -her diabetes is complicated by stage 3 renal insufficiency,obesity/sedentary life and she remains at a high risk for more acute and chronic  complications which include CAD, CVA, CKD, retinopathy, and neuropathy. These are all discussed in detail with her.  - Nutritional counseling repeated at each appointment due to patients tendency to fall back in to old habits.  - The patient admits there is a room for improvement in their diet and drink choices. -  Suggestion is made  for the patient to avoid simple carbohydrates from their diet including Cakes, Sweet Desserts / Pastries, Ice Cream, Soda (diet and regular), Sweet Tea, Candies, Chips, Cookies, Sweet Pastries, Store Bought Juices, Alcohol in Excess of 1-2 drinks a day, Artificial Sweeteners, Coffee Creamer, and "Sugar-free" Products. This will help patient to have stable blood glucose profile and potentially avoid unintended weight gain.   - I encouraged the patient to switch to unprocessed or minimally processed complex starch and increased protein intake (animal or plant source), fruits, and vegetables.   - Patient is advised to stick to a routine mealtimes to eat 3 meals a day and avoid unnecessary snacks (to snack only to correct hypoglycemia).  - I have approached her with the following individualized plan to manage diabetes and patient agrees:   -She is benefiting from reinitiation of low-dose Metformin and her kidney function is stable (just slightly worse).  She is advised to continue with her Metformin 500 mg daily with breakfast as well as continuing her Glipizide 5 mg p.o. daily with breakfast .  Will recheck kidney function prior to next visit for surveillance.  -She is advised to continue monitoring blood sugar at least once a day, and as needed if symptoms arise, and to call the clinic if she has readings less than 70 or greater than 200 for 3 tests in a row.  - she is not a candidate for SGLT2 inhibitors due to CKD.  2) BP/HTN:  Her blood pressure is controlled to target for her age.  She is not currently on any antihypertensive medications at this time.  She does  monitor BP at home routinely, and reports she typically has good results less than 140/90.    3) Hyperlipidemia Her most recent lipid panel from 11/18/21 shows uncontrolled LDL of 136.  She stopped taking her Lipitor as it was causing her to lose her hair.  She prefers to try diet and exercise prior to restarting it.  4) Hypothyroidism-long-term diagnosis, took levothyroxine for more than 10 years.   -Her previsit thyroid function tests are consistent with appropriate hormone replacement.  She is advised to continue Levothyroxine 75 mcg po daily before breakfast.   - We discussed about the correct intake of her thyroid hormone, on empty stomach at fasting, with water, separated by at least 30 minutes from breakfast and other medications,  and separated by more than 4 hours from calcium, iron, multivitamins, acid reflux medications (PPIs). -Patient is made aware of the fact that thyroid hormone replacement is needed for life, dose to be adjusted by periodic monitoring of thyroid function tests.  5) Chronic Care/Health Maintenance: -she is encouraged to initiate and continue to follow up with Ophthalmology, Dentist,  Podiatrist at least yearly or according to recommendations, and advised to  stay away from smoking. I have recommended yearly flu vaccine and pneumonia vaccine at least every 5 years; moderate intensity exercise for up to 150 minutes weekly; and  sleep for at least 7 hours a day.  - I advised patient to maintain close follow up with Sherrilee Gilles, DO for primary care needs.  -She stopped taking her Lipitor since last visit, says it was making her hair fall out.  Will recheck lipid panel prior to next visit.    I spent 30 minutes in the care of the patient today including review of labs from [place], Lipids, Thyroid Function, Hematology (current and previous including abstractions from other facilities); face-to-face time discussing  her blood glucose readings/logs, discussing hypoglycemia  and hyperglycemia episodes and symptoms, medications doses, her options of short and long term treatment based on the latest standards of care / guidelines;  discussion about incorporating lifestyle medicine;  and documenting the encounter. Risk reduction counseling performed per USPSTF guidelines to reduce obesity and cardiovascular risk factors.     Please refer to Patient Instructions for Blood Glucose Monitoring and Insulin/Medications Dosing Guide"  in media tab for additional information. Please  also refer to " Patient Self Inventory" in the Media  tab for reviewed elements of pertinent patient history.  Carol Adams participated in the discussions, expressed understanding, and voiced agreement with the above plans.  All questions were answered to her satisfaction. she is encouraged to contact clinic should she have any questions or concerns prior to her return visit.   Follow up plan: - Return in about 4 months (around 08/01/2022) for Diabetes F/U with A1c in office, Previsit labs, Bring meter and logs.  Rayetta Pigg, [facility] [address] [place],  [address] Phone: [phone] Fax: [phone]   03/31/2022, 10:41 AM

## 2022-04-01 ENCOUNTER — Other Ambulatory Visit: Payer: Self-pay | Admitting: Nurse Practitioner

## 2022-04-01 DIAGNOSIS — N1831 Type 2 diabetes mellitus with diabetic chronic kidney disease: Secondary | ICD-10-CM

## 2022-05-18 ENCOUNTER — Other Ambulatory Visit: Payer: Self-pay | Admitting: Nurse Practitioner

## 2022-06-08 LAB — BASIC METABOLIC PANEL
BUN: 17 (ref 4–21)
Creatinine: 1.2 — AB (ref 0.5–1.1)

## 2022-06-08 LAB — LIPID PANEL
LDL Cholesterol: 13
Triglycerides: 80 (ref 40–160)

## 2022-06-08 LAB — COMPREHENSIVE METABOLIC PANEL: eGFR: 46

## 2022-06-08 LAB — CBC AND DIFFERENTIAL
HCT: 31 — AB (ref 36–46)
Hemoglobin: 9.8 — AB (ref 12.0–16.0)

## 2022-06-18 ENCOUNTER — Other Ambulatory Visit: Payer: Self-pay | Admitting: *Deleted

## 2022-06-18 ENCOUNTER — Telehealth: Payer: Self-pay | Admitting: Nurse Practitioner

## 2022-06-18 DIAGNOSIS — E1122 Type 2 diabetes mellitus with diabetic chronic kidney disease: Secondary | ICD-10-CM

## 2022-06-18 DIAGNOSIS — N1832 Chronic kidney disease, stage 3b: Secondary | ICD-10-CM

## 2022-06-18 MED ORDER — GLIPIZIDE ER 5 MG PO TB24: 5.0000 mg | ORAL_TABLET | Freq: Every day | ORAL | 3 refills | Status: DC

## 2022-06-18 NOTE — Telephone Encounter (Signed)
New message     1. Which medications need to be refilled? (please list name of each medication and dose if known) glipiZIDE (GLUCOTROL XL) 5 MG 24 hr tablet   2. Which pharmacy/location (including street and city if local pharmacy) is medication to be sent to? [facility] on [facility] [place]    3. Do they need a 30 day or 90 day supply? 90 day supply

## 2022-06-18 NOTE — Telephone Encounter (Signed)
Patient's request was sent to her pharmacy.

## 2022-06-18 NOTE — Telephone Encounter (Signed)
Patient's prescription was sent to her pharmacy.  

## 2022-07-14 NOTE — Telephone Encounter (Signed)
Patient's husband called and ask that the patient's prescriptions that Loree Fee writes be transferred to  the [facility] located on [facility] in [place]. Patient is having trouble with [employer] .  Once this is done I will call the patient and let her know that this has been done [phone]

## 2022-07-15 MED ORDER — METFORMIN HCL ER 500 MG PO TB24: 500.0000 mg | ORAL_TABLET | Freq: Every day | ORAL | 1 refills | Status: DC

## 2022-07-15 MED ORDER — LEVOTHYROXINE SODIUM 75 MCG PO TABS: 75.0000 ug | ORAL_TABLET | Freq: Every day | ORAL | 3 refills | Status: DC

## 2022-07-15 MED ORDER — GLIPIZIDE ER 5 MG PO TB24: 5.0000 mg | ORAL_TABLET | Freq: Every day | ORAL | 3 refills | Status: DC

## 2022-07-15 NOTE — Telephone Encounter (Signed)
Patient's was called and her husband was made aware that the prescriptions had been sent to her new pharmacy.Marland Kitchen

## 2022-07-15 NOTE — Telephone Encounter (Signed)
I sent Metformin, Glipizide and Levothyroxine to [facility].  In the future, they can just call the pharmacy and request they reach out to the previous pharmacy to have them transferred (they dont have to go through the individual doctors).

## 2022-07-29 LAB — COMPREHENSIVE METABOLIC PANEL
ALT: 24 IU/L (ref 0–32)
AST: 23 IU/L (ref 0–40)
Albumin/Globulin Ratio: 1.5 (ref 1.2–2.2)
Albumin: 4.4 g/dL (ref 3.8–4.8)
Alkaline Phosphatase: 116 IU/L (ref 44–121)
BUN/Creatinine Ratio: 12 (ref 12–28)
BUN: 15 mg/dL (ref 8–27)
Bilirubin Total: 0.5 mg/dL (ref 0.0–1.2)
CO2: 21 mmol/L (ref 20–29)
Calcium: 9.3 mg/dL (ref 8.7–10.3)
Chloride: 104 mmol/L (ref 96–106)
Creatinine, Ser: 1.25 mg/dL — ABNORMAL HIGH (ref 0.57–1.00)
Globulin, Total: 2.9 g/dL (ref 1.5–4.5)
Glucose: 192 mg/dL — ABNORMAL HIGH (ref 70–99)
Potassium: 4.8 mmol/L (ref 3.5–5.2)
Sodium: 142 mmol/L (ref 134–144)
Total Protein: 7.3 g/dL (ref 6.0–8.5)
eGFR: 44 mL/min/{1.73_m2} — ABNORMAL LOW (ref >59)

## 2022-08-03 NOTE — Patient Instructions (Signed)

## 2022-08-04 ENCOUNTER — Ambulatory Visit (INDEPENDENT_AMBULATORY_CARE_PROVIDER_SITE_OTHER): Payer: Medicare Other | Admitting: Nurse Practitioner

## 2022-08-04 ENCOUNTER — Encounter: Payer: Self-pay | Admitting: Nurse Practitioner

## 2022-08-04 VITALS — BP 135/75 | HR 83 | Ht 64.0 in | Wt 198.0 lb

## 2022-08-04 DIAGNOSIS — N1832 Chronic kidney disease, stage 3b: Secondary | ICD-10-CM | POA: Diagnosis not present

## 2022-08-04 DIAGNOSIS — E039 Hypothyroidism, unspecified: Secondary | ICD-10-CM | POA: Diagnosis not present

## 2022-08-04 DIAGNOSIS — I10 Essential (primary) hypertension: Secondary | ICD-10-CM

## 2022-08-04 DIAGNOSIS — E1122 Type 2 diabetes mellitus with diabetic chronic kidney disease: Secondary | ICD-10-CM

## 2022-08-04 DIAGNOSIS — E559 Vitamin D deficiency, unspecified: Secondary | ICD-10-CM

## 2022-08-04 DIAGNOSIS — E782 Mixed hyperlipidemia: Secondary | ICD-10-CM

## 2022-08-04 DIAGNOSIS — N1831 Chronic kidney disease, stage 3a: Secondary | ICD-10-CM

## 2022-08-04 LAB — POCT GLYCOSYLATED HEMOGLOBIN (HGB A1C): Hemoglobin A1C: 8.8 % — AB (ref 4.0–5.6)

## 2022-08-04 MED ORDER — ACCU-CHEK FASTCLIX LANCETS MISC: 3 refills | Status: DC

## 2022-08-04 MED ORDER — ACCU-CHEK GUIDE VI STRP: ORAL_STRIP | 3 refills | Status: DC

## 2022-08-04 MED ORDER — TIRZEPATIDE 5 MG/0.5ML ~~LOC~~ SOAJ: 5.0000 mg | SUBCUTANEOUS | 1 refills | Status: DC

## 2022-08-04 MED ORDER — TIRZEPATIDE 2.5 MG/0.5ML ~~LOC~~ SOAJ: 2.5000 mg | SUBCUTANEOUS | 0 refills | Status: DC

## 2022-08-04 NOTE — Progress Notes (Signed)
08/04/2022, 12:04 PM          Endocrinology follow-up note   Subjective:    Patient ID: Carol Adams, female    DOB: Nov 04, 1945.  Quanique Krakowski is being seen in follow-up for management of currently uncontrolled symptomatic type 2 diabetes, hypothyroidism, hyperlipidemia, hypertension.  She is accompanied by her husband today.  PMD:   Sherrilee Gilles, DO.   Past Medical History:  Diagnosis Date   Diabetes mellitus, type II ([place])    Hypothyroidism    Past Surgical History:  Procedure Laterality Date   ABDOMINAL HYSTERECTOMY     CHOLECYSTECTOMY     Social History   Socioeconomic History   Marital status: Married    Spouse name: Not on file   Number of children: Not on file   Years of education: Not on file   Highest education level: Not on file  Occupational History   Not on file  Tobacco Use   Smoking status: Never   Smokeless tobacco: Never  Vaping Use   Vaping Use: Never used  Substance and Sexual Activity   Alcohol use: Never   Drug use: Never   Sexual activity: Not on file  Other Topics Concern   Not on file  Social History Narrative   Not on file   Social Determinants of Health   Financial Resource Strain: Not on file  Food Insecurity: Not on file  Transportation Needs: Not on file  Physical Activity: Not on file  Stress: Not on file  Social Connections: Not on file   Outpatient Encounter Medications as of 08/04/2022  Medication Sig   cholecalciferol (VITAMIN D3) 25 MCG (1000 UNIT) tablet Take 5,000 Units by mouth daily.   levothyroxine (SYNTHROID) 75 MCG tablet Take 1 tablet (75 mcg total) by mouth daily before breakfast.   loratadine (CLARITIN) 10 MG tablet Take 10 mg by mouth daily.   metFORMIN (GLUCOPHAGE-XR) 500 MG 24 hr tablet Take 1 tablet (500 mg total) by mouth daily with breakfast.   Multiple Vitamin (MULTIVITAMIN) capsule Take 1 capsule by mouth daily.   omeprazole (PRILOSEC) 20 MG capsule TAKE 1 CAPSULE BY MOUTH EVERY  DAY   PARoxetine (PAXIL) 20 MG tablet Take 20 mg by mouth daily.   tirzepatide [facility]) 2.5 MG/0.5ML Pen Inject 2.5 mg into the skin once a week.   tirzepatide [facility]) 5 MG/0.5ML Pen Inject 5 mg into the skin once a week.   trandolapril (MAVIK) 4 MG tablet daily as needed.   [DISCONTINUED] Accu-Chek FastClix Lancets MISC USE 1 TO 2 TIMES DAILY   [DISCONTINUED] ACCU-CHEK GUIDE test strip USE TO TEST BLOOD GLUCOSE 1 TO 2 TIMES DAILY AS DIRECTED   [DISCONTINUED] glipiZIDE (GLUCOTROL XL) 5 MG 24 hr tablet Take 1 tablet (5 mg total) by mouth daily with breakfast.   Accu-Chek FastClix Lancets MISC USE 1 TO 2 TIMES DAILY   glucose blood (ACCU-CHEK GUIDE) test strip Use as instructed to monitor glucose twice daily   No facility-administered encounter medications on file as of 08/04/2022.    ALLERGIES: Allergies  Allergen Reactions   Cephalexin Rash    VACCINATION STATUS: Immunization History  Administered Date(s) Administered   PFIZER Comirnaty(Gray Top)Covid-19 Tri-Sucrose Vaccine 09/16/2019, 10/07/2019   PFIZER(Purple Top)SARS-COV-2 Vaccination 09/16/2019, 10/07/2019    Diabetes She presents for her follow-up diabetic visit. She has type 2 diabetes mellitus. Onset time: She was recently diagnosed with type 2 diabetes at age 33 years. Her disease course has been improving.  There are no hypoglycemic associated symptoms. Pertinent negatives for hypoglycemia include no confusion, headaches, pallor or seizures. There are no diabetic associated symptoms. Pertinent negatives for diabetes include no polydipsia, no polyphagia and no polyuria. There are no hypoglycemic complications. Symptoms are stable. Diabetic complications include nephropathy. Risk factors for coronary artery disease include diabetes mellitus, dyslipidemia, hypertension, obesity, sedentary lifestyle and post-menopausal. Current diabetic treatment includes oral agent (dual therapy). She is compliant with treatment all of the time.  Her weight is fluctuating minimally. She is following a diabetic diet. When asked about meal planning, she reported none. She has not had a previous visit with a dietitian. She participates in exercise intermittently. Her home blood glucose trend is decreasing steadily. Her breakfast blood glucose range is generally 140-180 mg/dl. Her overall blood glucose range is 140-180 mg/dl. (She presents today with her meter and logs showing mostly at target glycemic profile.  Her POCT A1c today is 8.8%, increasing from last visit of 7.7%.  Analysis of her meter shows 7-day average of 159, 14-day average of 163, 30-day average of 177, 90-day average of 180.  She admits she struggled with her diet over the holiday season.  She denies any hypoglycemia. ) An ACE inhibitor/angiotensin II receptor blocker is not being taken. She does not see a podiatrist.Eye exam is current.  Hypertension This is a chronic problem. The current episode started more than 1 year ago. The problem has been waxing and waning since onset. The problem is uncontrolled. Pertinent negatives include no headaches or palpitations. Agents associated with hypertension include thyroid hormones. Risk factors for coronary artery disease include diabetes mellitus, dyslipidemia, sedentary lifestyle, obesity and post-menopausal state. Past treatments include nothing. Compliance problems include diet and exercise.  Hypertensive end-organ damage includes kidney disease. Identifiable causes of hypertension include a thyroid problem.   Review of systems  Constitutional: + Minimally fluctuating body weight,  current Body mass index is 33.99 kg/m. , no fatigue, no subjective hyperthermia, no subjective hypothermia Eyes: no blurry vision, no xerophthalmia ENT: no sore throat, no nodules palpated in throat, no dysphagia/odynophagia, no hoarseness Cardiovascular: no chest pain, no shortness of breath, no palpitations, no leg swelling Respiratory: no cough, no  shortness of breath Gastrointestinal: no nausea/vomiting/diarrhea Musculoskeletal: no muscle/joint aches Skin: no rashes, no hyperemia Neurological: no tremors, no numbness, no tingling, no dizziness Psychiatric: no depression, no anxiety   Objective:    BP 135/75 (BP Location: Right Arm, Patient Position: Sitting, Cuff Size: Large)   Pulse 83   Ht 5\' 4"  (1.626 m)   Wt 198 lb (89.8 kg)   BMI 33.99 kg/m   Wt Readings from Last 3 Encounters:  08/04/22 198 lb (89.8 kg)  03/31/22 198 lb 6.4 oz (90 kg)  11/25/21 196 lb (88.9 kg)    BP Readings from Last 3 Encounters:  08/04/22 135/75  03/31/22 126/66  11/25/21 (!) 157/81     Physical Exam- Limited  Constitutional:  Body mass index is 33.99 kg/m. , not in acute distress, normal state of mind Eyes:  EOMI, no exophthalmos Neck: Supple Musculoskeletal: no gross deformities, strength intact in all four extremities, no gross restriction of joint movements Skin:  no rashes, no hyperemia Neurological: no tremor with outstretched hands   Diabetic Foot Exam - Simple   No data filed     Recent Results (from the past 2160 hour(s))  CBC and differential     Status: Abnormal   Collection Time: 06/08/22 12:00 AM  Result Value Ref Range  Hemoglobin 9.8 (A) 12.0 - 16.0   HCT 31 (A) 36 - 46  Basic metabolic panel     Status: Abnormal   Collection Time: 06/08/22 12:00 AM  Result Value Ref Range   BUN 17 4 - 21   Creatinine 1.2 (A) 0.5 - 1.1  Comprehensive metabolic panel     Status: None   Collection Time: 06/08/22 12:00 AM  Result Value Ref Range   eGFR 46   Lipid panel     Status: None   Collection Time: 06/08/22 12:00 AM  Result Value Ref Range   Triglycerides 80 40 - 160   LDL Cholesterol 13   Comprehensive metabolic panel     Status: Abnormal   Collection Time: 07/28/22  8:22 AM  Result Value Ref Range   Glucose 192 (H) 70 - 99 mg/dL   BUN 15 8 - 27 mg/dL   Creatinine, Ser 1.25 (H) 0.57 - 1.00 mg/dL   eGFR 44 (L)  >59 mL/min/1.73   BUN/Creatinine Ratio 12 12 - 28   Sodium 142 134 - 144 mmol/L   Potassium 4.8 3.5 - 5.2 mmol/L   Chloride 104 96 - 106 mmol/L   CO2 21 20 - 29 mmol/L   Calcium 9.3 8.7 - 10.3 mg/dL   Total Protein 7.3 6.0 - 8.5 g/dL   Albumin 4.4 3.8 - 4.8 g/dL   Globulin, Total 2.9 1.5 - 4.5 g/dL   Albumin/Globulin Ratio 1.5 1.2 - 2.2   Bilirubin Total 0.5 0.0 - 1.2 mg/dL   Alkaline Phosphatase 116 44 - 121 IU/L   AST 23 0 - 40 IU/L   ALT 24 0 - 32 IU/L  HgB A1c     Status: Abnormal   Collection Time: 08/04/22 10:37 AM  Result Value Ref Range   Hemoglobin A1C 8.8 (A) 4.0 - 5.6 %   HbA1c POC (<> result, manual entry)     HbA1c, POC (prediabetic range)     HbA1c, POC (controlled diabetic range)       Assessment & Plan:   1) 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    She presents today with her meter and logs showing mostly at target glycemic profile.  Her POCT A1c today is 8.8%, increasing from last visit of 7.7%.  Analysis of her meter shows 7-day average of 159, 14-day average of 163, 30-day average of 177, 90-day average of 180.  She admits she struggled with her diet over the holiday season.  She denies any hypoglycemia.  Her recent labs reviewed and shows stable kidney function.  -her diabetes is complicated by stage 3 renal insufficiency,obesity/sedentary life and she remains at a high risk for more acute and chronic complications which include CAD, CVA, CKD, retinopathy, and neuropathy. These are all discussed in detail with her.  - Nutritional counseling repeated at each appointment due to patients tendency to fall back in to old habits.  - The patient admits there is a room for improvement in their diet and drink choices. -  Suggestion is made for the patient to avoid simple carbohydrates from their diet including Cakes, Sweet Desserts / Pastries, Ice  Cream, Soda (diet and regular), Sweet Tea, Candies, Chips, Cookies, Sweet Pastries, Store Bought Juices, Alcohol in Excess of 1-2 drinks a day, Artificial Sweeteners, Coffee Creamer, and "Sugar-free" Products. This will help patient to have stable blood glucose profile and potentially  avoid unintended weight gain.   - I encouraged the patient to switch to unprocessed or minimally processed complex starch and increased protein intake (animal or plant source), fruits, and vegetables.   - Patient is advised to stick to a routine mealtimes to eat 3 meals a day and avoid unnecessary snacks (to snack only to correct hypoglycemia).  - I have approached her with the following individualized plan to manage diabetes and patient agrees:   -She is advised to continue with her Metformin 500 mg daily with breakfast.  Will stop her Glipizide and trial her on Mounjaro 2.5 mg SQ weekly x 1 month, then will increase to 5 mg SQ weekly if she tolerates it well.  She could benefit from this GIP therapy to reduce cardiovascular risk.  She does not have personal or family history of thyroid cancer, no history of pancreatitis.  She does not smoke or have high triglycerides in the blood, making her a good candidate for this medication.  -She is advised to continue monitoring blood sugar at least once a day, and as needed if symptoms arise, and to call the clinic if she has readings less than 70 or greater than 200 for 3 tests in a row.  - she is not a candidate for SGLT2 inhibitors due to CKD.  2) BP/HTN:  Her blood pressure is controlled to target for her age.  She is not currently on any antihypertensive medications at this time.  She does monitor BP at home routinely, and reports she typically has good results less than 140/90.    3) Hyperlipidemia Her most recent lipid panel from 11/18/21 shows uncontrolled LDL of 136.  She stopped taking her Lipitor as it was causing her to lose her hair.  She prefers to try diet and  exercise prior to restarting it.  4) Hypothyroidism-long-term diagnosis, took levothyroxine for more than 10 years.   -There are no recent TFTs to review.  She is advised to continue Levothyroxine 75 mcg po daily before breakfast.   - We discussed about the correct intake of her thyroid hormone, on empty stomach at fasting, with water, separated by at least 30 minutes from breakfast and other medications,  and separated by more than 4 hours from calcium, iron, multivitamins, acid reflux medications (PPIs). -Patient is made aware of the fact that thyroid hormone replacement is needed for life, dose to be adjusted by periodic monitoring of thyroid function tests.  5) Chronic Care/Health Maintenance: -she is encouraged to initiate and continue to follow up with Ophthalmology, Dentist,  Podiatrist at least yearly or according to recommendations, and advised to  stay away from smoking. I have recommended yearly flu vaccine and pneumonia vaccine at least every 5 years; moderate intensity exercise for up to 150 minutes weekly; and  sleep for at least 7 hours a day.  - I advised patient to maintain close follow up with Sherrilee Gilles, DO for primary care needs.  -She stopped taking her Lipitor since last visit, says it was making her hair fall out.  Will recheck lipid panel prior to next visit.      I spent  46  minutes in the care of the patient today including review of labs from [facility], Lipids, Thyroid Function, Hematology (current and previous including abstractions from other facilities); face-to-face time discussing  her blood glucose readings/logs, discussing hypoglycemia and hyperglycemia episodes and symptoms, medications doses, her options of short and long term treatment based on the latest standards of care /  guidelines;  discussion about incorporating lifestyle medicine;  and documenting the encounter. Risk reduction counseling performed per USPSTF guidelines to reduce obesity and cardiovascular  risk factors.     Please refer to Patient Instructions for Blood Glucose Monitoring and Insulin/Medications Dosing Guide"  in media tab for additional information. Please  also refer to " Patient Self Inventory" in the Media  tab for reviewed elements of pertinent patient history.  [name] participated in the discussions, expressed understanding, and voiced agreement with the above plans.  All questions were answered to her satisfaction. she is encouraged to contact clinic should she have any questions or concerns prior to her return visit.   Follow up plan: - Return in about 3 months (around 11/02/2022) for Diabetes F/U with A1c in office, Previsit labs, Thyroid follow up, Bring meter and logs.  Ronny Bacon, [facility] [address] [place], [place] [address] Phone: [phone] Fax: [phone]   08/04/2022, 12:04 PM

## 2022-08-06 ENCOUNTER — Other Ambulatory Visit: Payer: Self-pay

## 2022-08-19 ENCOUNTER — Other Ambulatory Visit: Payer: Self-pay | Admitting: Nurse Practitioner

## 2022-08-31 ENCOUNTER — Telehealth: Payer: Self-pay | Admitting: *Deleted

## 2022-08-31 MED ORDER — TIRZEPATIDE 5 MG/0.5ML ~~LOC~~ SOAJ: 5.0000 mg | SUBCUTANEOUS | 1 refills | Status: DC

## 2022-08-31 NOTE — Telephone Encounter (Signed)
Patient was called and made aware. 

## 2022-08-31 NOTE — Telephone Encounter (Signed)
I sent in both prescription doses initially, but I just resent for the 5 mg dose once again.

## 2022-08-31 NOTE — Telephone Encounter (Signed)
Patient left a message that she has taken her last Mounjaro 2.5 mg. She is requesting that a prescription be sent in for the next dose.

## 2022-10-27 LAB — COMPREHENSIVE METABOLIC PANEL
ALT: 25 IU/L (ref 0–32)
AST: 26 IU/L (ref 0–40)
Albumin/Globulin Ratio: 1.6 (ref 1.2–2.2)
Albumin: 4.2 g/dL (ref 3.8–4.8)
Alkaline Phosphatase: 103 IU/L (ref 44–121)
BUN/Creatinine Ratio: 13 (ref 12–28)
BUN: 15 mg/dL (ref 8–27)
Bilirubin Total: 0.3 mg/dL (ref 0.0–1.2)
CO2: 21 mmol/L (ref 20–29)
Calcium: 9.3 mg/dL (ref 8.7–10.3)
Chloride: 108 mmol/L — ABNORMAL HIGH (ref 96–106)
Creatinine, Ser: 1.2 mg/dL — ABNORMAL HIGH (ref 0.57–1.00)
Globulin, Total: 2.7 g/dL (ref 1.5–4.5)
Glucose: 149 mg/dL — ABNORMAL HIGH (ref 70–99)
Potassium: 4.9 mmol/L (ref 3.5–5.2)
Sodium: 143 mmol/L (ref 134–144)
Total Protein: 6.9 g/dL (ref 6.0–8.5)
eGFR: 46 mL/min/{1.73_m2} — ABNORMAL LOW (ref >59)

## 2022-10-27 LAB — TSH: TSH: 1.24 u[IU]/mL (ref 0.450–4.500)

## 2022-10-27 LAB — T4, FREE: Free T4: 1.42 ng/dL (ref 0.82–1.77)

## 2022-11-02 ENCOUNTER — Encounter: Payer: Self-pay | Admitting: Nurse Practitioner

## 2022-11-02 ENCOUNTER — Telehealth: Payer: Self-pay | Admitting: Nurse Practitioner

## 2022-11-02 ENCOUNTER — Ambulatory Visit (INDEPENDENT_AMBULATORY_CARE_PROVIDER_SITE_OTHER): Payer: Medicare Other | Admitting: Nurse Practitioner

## 2022-11-02 VITALS — BP 134/64 | HR 107 | Ht 64.0 in | Wt 188.6 lb

## 2022-11-02 DIAGNOSIS — E1122 Type 2 diabetes mellitus with diabetic chronic kidney disease: Secondary | ICD-10-CM

## 2022-11-02 DIAGNOSIS — I10 Essential (primary) hypertension: Secondary | ICD-10-CM

## 2022-11-02 DIAGNOSIS — E559 Vitamin D deficiency, unspecified: Secondary | ICD-10-CM

## 2022-11-02 DIAGNOSIS — E039 Hypothyroidism, unspecified: Secondary | ICD-10-CM

## 2022-11-02 DIAGNOSIS — N1831 Chronic kidney disease, stage 3a: Secondary | ICD-10-CM | POA: Diagnosis not present

## 2022-11-02 DIAGNOSIS — E782 Mixed hyperlipidemia: Secondary | ICD-10-CM

## 2022-11-02 LAB — POCT GLYCOSYLATED HEMOGLOBIN (HGB A1C): Hemoglobin A1C: 6.5 % — AB (ref 4.0–5.6)

## 2022-11-02 LAB — POCT UA - MICROALBUMIN
Creatinine, POC: 300 mg/dL
Microalbumin Ur, POC: 150 mg/L

## 2022-11-02 MED ORDER — TIRZEPATIDE 2.5 MG/0.5ML ~~LOC~~ SOAJ: 2.5000 mg | SUBCUTANEOUS | 0 refills | Status: DC

## 2022-11-02 NOTE — Telephone Encounter (Signed)
Pt called and said that she is at [facility] on [address] and they do have the [facility] in [place] in 2[place]5mg  if you could send a RX for her

## 2022-11-02 NOTE — Telephone Encounter (Signed)
Done- gave written script to patient-

## 2022-11-02 NOTE — Addendum Note (Signed)
Addended by: Dani Gobble on: 11/02/2022 11:03 AM   Modules accepted: Orders

## 2022-11-02 NOTE — Progress Notes (Signed)
11/02/2022, 10:43 AM          Endocrinology follow-up note   Subjective:    Patient ID: Carol Adams, female    DOB: August 19, 1945.  Carol Adams is being seen in follow-up for management of currently uncontrolled symptomatic type 2 diabetes, hypothyroidism, hyperlipidemia, hypertension.  She is accompanied by her husband today.  PMD:   Jonathon Bellows, DO.   Past Medical History:  Diagnosis Date   Diabetes mellitus, type II (HCC)    Hypothyroidism    Past Surgical History:  Procedure Laterality Date   ABDOMINAL HYSTERECTOMY     CHOLECYSTECTOMY     Social History   Socioeconomic History   Marital status: Married    Spouse name: Not on file   Number of children: Not on file   Years of education: Not on file   Highest education level: Not on file  Occupational History   Not on file  Tobacco Use   Smoking status: Never   Smokeless tobacco: Never  Vaping Use   Vaping Use: Never used  Substance and Sexual Activity   Alcohol use: Never   Drug use: Never   Sexual activity: Not on file  Other Topics Concern   Not on file  Social History Narrative   Not on file   Social Determinants of Health   Financial Resource Strain: Not on file  Food Insecurity: Not on file  Transportation Needs: Not on file  Physical Activity: Not on file  Stress: Not on file  Social Connections: Not on file   Outpatient Encounter Medications as of 11/02/2022  Medication Sig   Accu-Chek FastClix Lancets MISC USE 1 TO 2 TIMES DAILY   cholecalciferol (VITAMIN D3) 25 MCG (1000 UNIT) tablet Take 5,000 Units by mouth daily.   glucose blood (ACCU-CHEK GUIDE) test strip Use as instructed to monitor glucose twice daily   levothyroxine (SYNTHROID) 75 MCG tablet Take 1 tablet (75 mcg total) by mouth daily before breakfast.   loratadine (CLARITIN) 10 MG tablet Take 10 mg by mouth daily.   metFORMIN (GLUCOPHAGE-XR) 500 MG 24 hr tablet Take 1 tablet (500 mg total) by mouth daily with  breakfast.   Multiple Vitamin (MULTIVITAMIN) capsule Take 1 capsule by mouth daily.   omeprazole (PRILOSEC) 20 MG capsule TAKE 1 CAPSULE BY MOUTH EVERY DAY   PARoxetine (PAXIL) 20 MG tablet Take 20 mg by mouth daily.   tirzepatide [facility]) 5 MG/0.5ML Pen Inject 5 mg into the skin once a week.   trandolapril (MAVIK) 4 MG tablet daily as needed.   No facility-administered encounter medications on file as of 11/02/2022.    ALLERGIES: Allergies  Allergen Reactions   Cephalexin Rash    VACCINATION STATUS: Immunization History  Administered Date(s) Administered   PFIZER Comirnaty(Gray Top)Covid-19 Tri-Sucrose Vaccine 09/16/2019, 10/07/2019   PFIZER(Purple Top)SARS-COV-2 Vaccination 09/16/2019, 10/07/2019    Diabetes She presents for her follow-up diabetic visit. She has type 2 diabetes mellitus. Onset time: She was recently diagnosed with type 2 diabetes at age 77 years. Her disease course has been improving. There are no hypoglycemic associated symptoms. Pertinent negatives for hypoglycemia include no confusion, headaches, pallor or seizures. Associated symptoms include weight loss. Pertinent negatives for diabetes include no polydipsia, no polyphagia and no polyuria. There are no hypoglycemic complications. Symptoms are stable. Diabetic complications include nephropathy. Risk factors for coronary artery disease include diabetes mellitus, dyslipidemia, hypertension, obesity, sedentary lifestyle and post-menopausal. Current diabetic treatment includes oral agent (dual therapy).  She is compliant with treatment all of the time. Her weight is decreasing steadily. She is following a diabetic diet. When asked about meal planning, she reported none. She has not had a previous visit with a dietitian. She participates in exercise intermittently. Her home blood glucose trend is decreasing steadily. Her breakfast blood glucose range is generally 90-110 mg/dl. (She presents today, accompanied by her husband,  with her meter and logs showing at target glycemic profile overall.  Her POCT A1c today is 6.5%, improving from last visit of 8.8%.  Analysis of her meter shows 7-day average of 139, 14-day average of 139, 30-day average of 131, 90-day average of 133.  She has lost 10 lbs since addition of Mounjaro and overall feels great!  She notes she is having trouble getting her medications though. ) An ACE inhibitor/angiotensin II receptor blocker is not being taken. She does not see a podiatrist.Eye exam is current.  Hypertension This is a chronic problem. The current episode started more than 1 year ago. The problem has been waxing and waning since onset. The problem is uncontrolled. Pertinent negatives include no headaches or palpitations. Agents associated with hypertension include thyroid hormones. Risk factors for coronary artery disease include diabetes mellitus, dyslipidemia, sedentary lifestyle, obesity and post-menopausal state. Past treatments include nothing. Compliance problems include diet and exercise.  Hypertensive end-organ damage includes kidney disease. Identifiable causes of hypertension include a thyroid problem.   Review of systems  Constitutional: + steadily decreasing body weight,  current Body mass index is 32.37 kg/m. , no fatigue, no subjective hyperthermia, no subjective hypothermia Eyes: no blurry vision, no xerophthalmia ENT: no sore throat, no nodules palpated in throat, no dysphagia/odynophagia, no hoarseness Cardiovascular: no chest pain, no shortness of breath, no palpitations, no leg swelling Respiratory: no cough, no shortness of breath Gastrointestinal: no nausea/vomiting/diarrhea Musculoskeletal: no muscle/joint aches Skin: no rashes, no hyperemia Neurological: no tremors, no numbness, no tingling, no dizziness Psychiatric: no depression, no anxiety   Objective:    BP 134/64 (BP Location: Right Arm, Patient Position: Sitting, Cuff Size: Large)   Pulse (!) 107   Ht  5\' 4"  (1.626 m)   Wt 188 lb 9.6 oz (85.5 kg)   BMI 32.37 kg/m   Wt Readings from Last 3 Encounters:  11/02/22 188 lb 9.6 oz (85.5 kg)  08/04/22 198 lb (89.8 kg)  03/31/22 198 lb 6.4 oz (90 kg)    BP Readings from Last 3 Encounters:  11/02/22 134/64  08/04/22 135/75  03/31/22 126/66     Physical Exam- Limited  Constitutional:  Body mass index is 32.37 kg/m. , not in acute distress, normal state of mind Eyes:  EOMI, no exophthalmos Neck: Supple Musculoskeletal: no gross deformities, strength intact in all four extremities, no gross restriction of joint movements Skin:  no rashes, no hyperemia Neurological: no tremor with outstretched hands   Diabetic Foot Exam - Simple   No data filed     Recent Results (from the past 2160 hour(s))  HgB A1c     Status: Abnormal   Collection Time: 08/04/22 10:37 AM  Result Value Ref Range   Hemoglobin A1C 8.8 (A) 4.0 - 5.6 %   HbA1c POC (<> result, manual entry)     HbA1c, POC (prediabetic range)     HbA1c, POC (controlled diabetic range)    TSH     Status: None   Collection Time: 10/26/22  8:30 AM  Result Value Ref Range   TSH 1.240 0.450 - 4.500 uIU/mL  T4, free     Status: None   Collection Time: 10/26/22  8:30 AM  Result Value Ref Range   Free T4 1.42 0.82 - 1.77 ng/dL  Comprehensive metabolic panel     Status: Abnormal   Collection Time: 10/26/22  8:30 AM  Result Value Ref Range   Glucose 149 (H) 70 - 99 mg/dL   BUN 15 8 - 27 mg/dL   Creatinine, Ser 1.61 (H) 0.57 - 1.00 mg/dL   eGFR 46 (L) >09 UE/AVW/0.98   BUN/Creatinine Ratio 13 12 - 28   Sodium 143 134 - 144 mmol/L   Potassium 4.9 3.5 - 5.2 mmol/L   Chloride 108 (H) 96 - 106 mmol/L   CO2 21 20 - 29 mmol/L   Calcium 9.3 8.7 - 10.3 mg/dL   Total Protein 6.9 6.0 - 8.5 g/dL   Albumin 4.2 3.8 - 4.8 g/dL   Globulin, Total 2.7 1.5 - 4.5 g/dL   Albumin/Globulin Ratio 1.6 1.2 - 2.2   Bilirubin Total 0.3 0.0 - 1.2 mg/dL   Alkaline Phosphatase 103 44 - 121 IU/L   AST 26 0  - 40 IU/L   ALT 25 0 - 32 IU/L  POCT UA - Microalbumin     Status: Abnormal   Collection Time: 11/02/22 10:29 AM  Result Value Ref Range   Microalbumin Ur, POC 150 mg/L mg/L   Creatinine, POC 300 mg/dL mg/dL   Albumin/Creatinine Ratio, Urine, POC 30-300 mg/G   HgB A1c     Status: Abnormal   Collection Time: 11/02/22 10:29 AM  Result Value Ref Range   Hemoglobin A1C 6.5 (A) 4.0 - 5.6 %   HbA1c POC (<> result, manual entry)     HbA1c, POC (prediabetic range)     HbA1c, POC (controlled diabetic range)       Assessment & Plan:   1) Controlled 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    She presents today, accompanied by her husband, with her meter and logs showing at target glycemic profile overall.  Her POCT A1c today is 6.5%, improving from last visit of 8.8%.  Analysis of her meter shows 7-day average of 139, 14-day average of 139, 30-day average of 131, 90-day average of 133.  She has lost 10 lbs since addition of Mounjaro and overall feels great!  She notes she is having trouble getting her medications though.  Her recent labs reviewed and shows stable kidney function.  -her diabetes is complicated by stage 3 renal insufficiency,obesity/sedentary life and she remains at a high risk for more acute and chronic complications which include CAD, CVA, CKD, retinopathy, and neuropathy. These are all discussed in detail with her.  - Nutritional counseling repeated at each appointment due to patients tendency to fall back in to old habits.  - The patient admits there is a room for improvement in their diet and drink choices. -  Suggestion is made for the patient to avoid simple carbohydrates from their diet including Cakes, Sweet Desserts / Pastries, Ice Cream, Soda (diet and regular), Sweet Tea, Candies, Chips, Cookies, Sweet Pastries, Store Bought Juices, Alcohol in Excess of 1-2 drinks a  day, Artificial Sweeteners, Coffee Creamer, and "Sugar-free" Products. This will help patient to have stable blood glucose profile and potentially avoid unintended weight gain.   - I encouraged the patient to switch to unprocessed or minimally processed complex starch and increased  protein intake (animal or plant source), fruits, and vegetables.   - Patient is advised to stick to a routine mealtimes to eat 3 meals a day and avoid unnecessary snacks (to snack only to correct hypoglycemia).  - I have approached her with the following individualized plan to manage diabetes and patient agrees:   -She is advised to continue with her Metformin 500 mg daily with breakfast and continue Mounjaro 5 mg SQ weekly when she is able to get it from the pharmacy.  I did advise her to reach out to different pharmacies to see if they may have it in stock.  -She is advised to continue monitoring blood sugar at least once a day, and as needed if symptoms arise, and to call the clinic if she has readings less than 70 or greater than 200 for 3 tests in a row.  - she is not a candidate for SGLT2 inhibitors due to CKD.  2) BP/HTN:  Her blood pressure is controlled to target for her age.  She is not currently on any antihypertensive medications at this time.  She does monitor BP at home routinely, and reports she typically has good results less than 140/90.    3) Hyperlipidemia Her most recent lipid panel from 06/08/22 shows controlled LDL of 13.  She is advised to continue Pravastatin 20 mg po daily.  4) Hypothyroidism-long-term diagnosis, took levothyroxine for more than 10 years.   -Her previsit TFTs are consistent with appropriate hormone replacement.  She is advised to continue Levothyroxine 75 mcg po daily before breakfast.   - We discussed about the correct intake of her thyroid hormone, on empty stomach at fasting, with water, separated by at least 30 minutes from breakfast and other medications,  and  separated by more than 4 hours from calcium, iron, multivitamins, acid reflux medications (PPIs). -Patient is made aware of the fact that thyroid hormone replacement is needed for life, dose to be adjusted by periodic monitoring of thyroid function tests.  5) Chronic Care/Health Maintenance: -she is encouraged to initiate and continue to follow up with Ophthalmology, Dentist,  Podiatrist at least yearly or according to recommendations, and advised to  stay away from smoking. I have recommended yearly flu vaccine and pneumonia vaccine at least every 5 years; moderate intensity exercise for up to 150 minutes weekly; and  sleep for at least 7 hours a day.  - I advised patient to maintain close follow up with Jonathon Bellows, DO for primary care needs.      I spent  30  minutes in the care of the patient today including review of labs from CMP, Lipids, Thyroid Function, Hematology (current and previous including abstractions from other facilities); face-to-face time discussing  her blood glucose readings/logs, discussing hypoglycemia and hyperglycemia episodes and symptoms, medications doses, her options of short and long term treatment based on the latest standards of care / guidelines;  discussion about incorporating lifestyle medicine;  and documenting the encounter. Risk reduction counseling performed per USPSTF guidelines to reduce obesity and cardiovascular risk factors.     Please refer to Patient Instructions for Blood Glucose Monitoring and Insulin/Medications Dosing Guide"  in media tab for additional information. Please  also refer to " Patient Self Inventory" in the Media  tab for reviewed elements of pertinent patient history.  Carol Adams participated in the discussions, expressed understanding, and voiced agreement with the above plans.  All questions were answered to her satisfaction. she is encouraged to contact clinic should she  have any questions or concerns prior to her return  visit.   Follow up plan: - Return in about 4 months (around 03/05/2023) for Diabetes F/U with A1c in office, No previsit labs, Bring meter and logs.  Ronny Bacon, [facility] [address] [place], [place] [address] Phone: [phone] Fax: [phone]   11/02/2022, 10:43 AM

## 2022-11-13 ENCOUNTER — Other Ambulatory Visit: Payer: Self-pay | Admitting: Nurse Practitioner

## 2023-02-09 ENCOUNTER — Other Ambulatory Visit: Payer: Self-pay | Admitting: Nurse Practitioner

## 2023-02-17 ENCOUNTER — Other Ambulatory Visit: Payer: Self-pay | Admitting: *Deleted

## 2023-02-17 ENCOUNTER — Telehealth: Payer: Self-pay | Admitting: Nurse Practitioner

## 2023-02-17 DIAGNOSIS — E1122 Type 2 diabetes mellitus with diabetic chronic kidney disease: Secondary | ICD-10-CM

## 2023-02-17 MED ORDER — ACCU-CHEK GUIDE VI STRP: ORAL_STRIP | 3 refills | Status: DC

## 2023-02-17 NOTE — Telephone Encounter (Signed)
RX has been sent to the [facility] on [address] [facility].

## 2023-02-17 NOTE — Telephone Encounter (Signed)
Patient's spouse called and said that he left a VM on your phone last week that she needs her test strips sent into [facility] on [address]. Please Advise. Thank you

## 2023-02-17 NOTE — Telephone Encounter (Signed)
Called and let the patient's husband know.

## 2023-03-05 ENCOUNTER — Encounter: Payer: Self-pay | Admitting: Nurse Practitioner

## 2023-03-05 ENCOUNTER — Ambulatory Visit (INDEPENDENT_AMBULATORY_CARE_PROVIDER_SITE_OTHER): Payer: Medicare Other | Admitting: Nurse Practitioner

## 2023-03-05 VITALS — BP 137/70 | HR 85 | Ht 64.0 in | Wt 186.2 lb

## 2023-03-05 DIAGNOSIS — E039 Hypothyroidism, unspecified: Secondary | ICD-10-CM

## 2023-03-05 DIAGNOSIS — N1831 Chronic kidney disease, stage 3a: Secondary | ICD-10-CM

## 2023-03-05 DIAGNOSIS — E559 Vitamin D deficiency, unspecified: Secondary | ICD-10-CM

## 2023-03-05 DIAGNOSIS — Z7985 Long-term (current) use of injectable non-insulin antidiabetic drugs: Secondary | ICD-10-CM | POA: Diagnosis not present

## 2023-03-05 DIAGNOSIS — I10 Essential (primary) hypertension: Secondary | ICD-10-CM | POA: Diagnosis not present

## 2023-03-05 DIAGNOSIS — Z7984 Long term (current) use of oral hypoglycemic drugs: Secondary | ICD-10-CM

## 2023-03-05 DIAGNOSIS — E782 Mixed hyperlipidemia: Secondary | ICD-10-CM | POA: Diagnosis not present

## 2023-03-05 DIAGNOSIS — E1122 Type 2 diabetes mellitus with diabetic chronic kidney disease: Secondary | ICD-10-CM | POA: Diagnosis not present

## 2023-03-05 LAB — POCT GLYCOSYLATED HEMOGLOBIN (HGB A1C): Hemoglobin A1C: 6.2 % — AB (ref 4.0–5.6)

## 2023-03-05 MED ORDER — LEVOTHYROXINE SODIUM 75 MCG PO TABS: 75.0000 ug | ORAL_TABLET | Freq: Every day | ORAL | 3 refills | Status: DC

## 2023-03-05 NOTE — Patient Instructions (Signed)

## 2023-03-05 NOTE — Progress Notes (Signed)
03/05/2023, 10:19 AM          Endocrinology follow-up note   Subjective:    Patient ID: Carol Adams, female    DOB: [birth year].  Carol Adams is being seen in follow-up for management of currently uncontrolled symptomatic type 2 diabetes, hypothyroidism, hyperlipidemia, hypertension.  She is accompanied by her husband today.  PMD:   Jonathon Bellows, DO.   Past Medical History:  Diagnosis Date   Diabetes mellitus, type II (HCC)    Hypothyroidism    Past Surgical History:  Procedure Laterality Date   ABDOMINAL HYSTERECTOMY     CHOLECYSTECTOMY     Social History   Socioeconomic History   Marital status: Married    Spouse name: Not on file   Number of children: Not on file   Years of education: Not on file   Highest education level: Not on file  Occupational History   Not on file  Tobacco Use   Smoking status: Never   Smokeless tobacco: Never  Vaping Use   Vaping status: Never Used  Substance and Sexual Activity   Alcohol use: Never   Drug use: Never   Sexual activity: Not on file  Other Topics Concern   Not on file  Social History Narrative   Not on file   Social Determinants of Health   Financial Resource Strain: Not on file  Food Insecurity: Not on file  Transportation Needs: Not on file  Physical Activity: Not on file  Stress: Not on file  Social Connections: Not on file   Outpatient Encounter Medications as of 03/05/2023  Medication Sig   Accu-Chek FastClix Lancets MISC USE 1 TO 2 TIMES DAILY   cholecalciferol (VITAMIN D3) 25 MCG (1000 UNIT) tablet Take 5,000 Units by mouth daily.   glucose blood (ACCU-CHEK GUIDE) test strip Use as instructed to monitor glucose twice daily   loratadine (CLARITIN) 10 MG tablet Take 10 mg by mouth daily.   MOUNJARO 5 MG/0.5ML Pen Inject 5 mg into the skin once a week.   Multiple Vitamin (MULTIVITAMIN) capsule Take 1 capsule by mouth daily.   omeprazole (PRILOSEC) 20 MG capsule TAKE 1 CAPSULE BY MOUTH  EVERY DAY   PARoxetine (PAXIL) 20 MG tablet Take 20 mg by mouth daily.   trandolapril (MAVIK) 4 MG tablet daily as needed.   [DISCONTINUED] levothyroxine (SYNTHROID) 75 MCG tablet Take 1 tablet (75 mcg total) by mouth daily before breakfast.   [DISCONTINUED] metFORMIN (GLUCOPHAGE-XR) 500 MG 24 hr tablet Take 1 tablet (500 mg total) by mouth daily with breakfast.   levothyroxine (SYNTHROID) 75 MCG tablet Take 1 tablet (75 mcg total) by mouth daily before breakfast.   [DISCONTINUED] tirzepatide (MOUNJARO) 2.5 MG/0.5ML Pen Inject 2.5 mg into the skin once a week. (Patient not taking: Reported on 03/05/2023)   No facility-administered encounter medications on file as of 03/05/2023.    ALLERGIES: Allergies  Allergen Reactions   Cephalexin Rash    VACCINATION STATUS: Immunization History  Administered Date(s) Administered   PFIZER(Purple Top)SARS-COV-2 Vaccination 09/16/2019, 10/07/2019    Diabetes She presents for her follow-up diabetic visit. She has type 2 diabetes mellitus. Onset time: She was recently diagnosed with type 2 diabetes at age 38 years. Her disease course has been improving. There are no hypoglycemic associated symptoms. Pertinent negatives for hypoglycemia include no confusion, headaches, pallor or seizures. Pertinent negatives for diabetes include no polydipsia, no polyphagia, no polyuria and no weight loss. There are no hypoglycemic complications.  Symptoms are stable. Diabetic complications include nephropathy. Risk factors for coronary artery disease include diabetes mellitus, dyslipidemia, hypertension, obesity, sedentary lifestyle and post-menopausal. Current diabetic treatment includes oral agent (dual therapy). She is compliant with treatment all of the time. Her weight is fluctuating minimally. She is following a diabetic diet. When asked about meal planning, she reported none. She has not had a previous visit with a dietitian. She participates in exercise intermittently. Her  home blood glucose trend is fluctuating minimally. Her breakfast blood glucose range is generally 90-110 mg/dl. (She presents today, accompanied by her husband, with her meter and logs showing at target glycemic profile overall.  Her POCT A1c today is 6.2%, improving from last visit of 6.5%.  Analysis of her meter shows 7-day average of 108, 14-day average of 109, 30-day average of 109, 90-day average of 116.  She just recently started Mounjaro 5 mg about 2 weeks ago, due to shortage issues. ) An ACE inhibitor/angiotensin II receptor blocker is not being taken. She does not see a podiatrist.Eye exam is current.  Hypertension This is a chronic problem. The current episode started more than 1 year ago. The problem has been waxing and waning since onset. The problem is uncontrolled. Pertinent negatives include no headaches or palpitations. Agents associated with hypertension include thyroid hormones. Risk factors for coronary artery disease include diabetes mellitus, dyslipidemia, sedentary lifestyle, obesity and post-menopausal state. Past treatments include nothing. Compliance problems include diet and exercise.  Hypertensive end-organ damage includes kidney disease. Identifiable causes of hypertension include a thyroid problem.   Review of systems  Constitutional: + stable body weight,  current Body mass index is 31.96 kg/m. , no fatigue, no subjective hyperthermia, no subjective hypothermia Eyes: no blurry vision, no xerophthalmia ENT: no sore throat, no nodules palpated in throat, no dysphagia/odynophagia, no hoarseness Cardiovascular: no chest pain, no shortness of breath, no palpitations, no leg swelling Respiratory: no cough, no shortness of breath Gastrointestinal: no nausea/vomiting/diarrhea Musculoskeletal: no muscle/joint aches Skin: no rashes, no hyperemia Neurological: no tremors, no numbness, no tingling, no dizziness Psychiatric: no depression, no anxiety   Objective:    BP 137/70  (BP Location: Right Arm, Patient Position: Sitting, Cuff Size: Large)   Pulse 85   Ht 5\' 4"  (1.626 m)   Wt 186 lb 3.2 oz (84.5 kg)   BMI 31.96 kg/m   Wt Readings from Last 3 Encounters:  03/05/23 186 lb 3.2 oz (84.5 kg)  11/02/22 188 lb 9.6 oz (85.5 kg)  08/04/22 198 lb (89.8 kg)    BP Readings from Last 3 Encounters:  03/05/23 137/70  11/02/22 134/64  08/04/22 135/75     Physical Exam- Limited  Constitutional:  Body mass index is 31.96 kg/m. , not in acute distress, normal state of mind Eyes:  EOMI, no exophthalmos Neck: Supple Musculoskeletal: no gross deformities, strength intact in all four extremities, no gross restriction of joint movements Skin:  no rashes, no hyperemia Neurological: no tremor with outstretched hands   Diabetic Foot Exam - Simple   No data filed     Recent Results (from the past 2160 hour(s))  HgB A1c     Status: Abnormal   Collection Time: 03/05/23 10:07 AM  Result Value Ref Range   Hemoglobin A1C 6.2 (A) 4.0 - 5.6 %   HbA1c POC (<> result, manual entry)     HbA1c, POC (prediabetic range)     HbA1c, POC (controlled diabetic range)        Assessment & Plan:  1) 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    She presents today, accompanied by her husband, with her meter and logs showing at target glycemic profile overall.  Her POCT A1c today is 6.2%, improving from last visit of 6.5%.  Analysis of her meter shows 7-day average of 108, 14-day average of 109, 30-day average of 109, 90-day average of 116.  She just recently started Mounjaro 5 mg about 2 weeks ago, due to shortage issues.  Her recent labs reviewed and shows stable kidney function.  -her diabetes is complicated by stage 3 renal insufficiency,obesity/sedentary life and she remains at a high risk for more acute and chronic complications which include CAD, CVA, CKD,  retinopathy, and neuropathy. These are all discussed in detail with her.  - Nutritional counseling repeated at each appointment due to patients tendency to fall back in to old habits.  - The patient admits there is a room for improvement in their diet and drink choices. -  Suggestion is made for the patient to avoid simple carbohydrates from their diet including Cakes, Sweet Desserts / Pastries, Ice Cream, Soda (diet and regular), Sweet Tea, Candies, Chips, Cookies, Sweet Pastries, Store Bought Juices, Alcohol in Excess of 1-2 drinks a day, Artificial Sweeteners, Coffee Creamer, and "Sugar-free" Products. This will help patient to have stable blood glucose profile and potentially avoid unintended weight gain.   - I encouraged the patient to switch to unprocessed or minimally processed complex starch and increased protein intake (animal or plant source), fruits, and vegetables.   - Patient is advised to stick to a routine mealtimes to eat 3 meals a day and avoid unnecessary snacks (to snack only to correct hypoglycemia).  - I have approached her with the following individualized plan to manage diabetes and patient agrees:   -She is advised to continue Mounjaro 5 mg SQ weekly.  I did stop her Metformin today to preserve kidney function and de-escalate treatment.   -She is advised to continue monitoring blood sugar at least once a day, and as needed if symptoms arise, and to call the clinic if she has readings less than 70 or greater than 200 for 3 tests in a row.  - she is not a candidate for SGLT2 inhibitors due to CKD.  2) BP/HTN:  Her blood pressure is controlled to target.  She is not currently on any antihypertensive medications at this time.  She does monitor BP at home routinely, and reports she typically has good results less than 140/90.    3) Hyperlipidemia Her most recent lipid panel from 06/08/22 shows controlled LDL of 13.  She is advised to continue Pravastatin 20 mg po  daily.  4) Hypothyroidism-long-term diagnosis, took levothyroxine for more than 10 years.   -There are no recent TFTs to review.  She is advised to continue Levothyroxine 75 mcg po daily before breakfast.  Will recheck prior to next visit and adjust dose accordingly.   - We discussed about the correct intake of her thyroid hormone, on empty stomach at fasting, with water, separated by at least 30 minutes from breakfast and other medications,  and separated by more than 4 hours from calcium, iron, multivitamins, acid reflux medications (PPIs). -Patient is made aware of the fact that thyroid hormone replacement is needed for life, dose to be adjusted by periodic monitoring of thyroid function tests.  5) Chronic Care/Health Maintenance: -  she is encouraged to initiate and continue to follow up with Ophthalmology, Dentist,  Podiatrist at least yearly or according to recommendations, and advised to  stay away from smoking. I have recommended yearly flu vaccine and pneumonia vaccine at least every 5 years; moderate intensity exercise for up to 150 minutes weekly; and  sleep for at least 7 hours a day.  - I advised patient to maintain close follow up with Jonathon Bellows, DO for primary care needs.     I spent  30  minutes in the care of the patient today including review of labs from CMP, Lipids, Thyroid Function, Hematology (current and previous including abstractions from other facilities); face-to-face time discussing  her blood glucose readings/logs, discussing hypoglycemia and hyperglycemia episodes and symptoms, medications doses, her options of short and long term treatment based on the latest standards of care / guidelines;  discussion about incorporating lifestyle medicine;  and documenting the encounter. Risk reduction counseling performed per USPSTF guidelines to reduce obesity and cardiovascular risk factors.     Please refer to Patient Instructions for Blood Glucose Monitoring and  Insulin/Medications Dosing Guide"  in media tab for additional information. Please  also refer to " Patient Self Inventory" in the Media  tab for reviewed elements of pertinent patient history.  [name] participated in the discussions, expressed understanding, and voiced agreement with the above plans.  All questions were answered to her satisfaction. she is encouraged to contact clinic should she have any questions or concerns prior to her return visit.   Follow up plan: - Return in about 4 months (around 07/05/2023) for Diabetes F/U with A1c in office, Thyroid follow up, Previsit labs.  Ronny Bacon, [facility] [address] [place], [place] [address] Phone: [phone] Fax: [phone]   03/05/2023, 10:19 AM

## 2023-04-22 ENCOUNTER — Other Ambulatory Visit: Payer: Self-pay | Admitting: *Deleted

## 2023-04-22 DIAGNOSIS — E039 Hypothyroidism, unspecified: Secondary | ICD-10-CM

## 2023-04-22 DIAGNOSIS — N1831 Chronic kidney disease, stage 3a: Secondary | ICD-10-CM

## 2023-04-22 MED ORDER — LEVOTHYROXINE SODIUM 75 MCG PO TABS: 75.0000 ug | ORAL_TABLET | Freq: Every day | ORAL | 3 refills | Status: DC

## 2023-05-15 ENCOUNTER — Other Ambulatory Visit: Payer: Self-pay | Admitting: Nurse Practitioner

## 2023-06-30 LAB — T4, FREE: Free T4: 1.43 ng/dL (ref 0.82–1.77)

## 2023-06-30 LAB — COMPREHENSIVE METABOLIC PANEL
ALT: 16 [IU]/L (ref 0–32)
AST: 20 [IU]/L (ref 0–40)
Albumin: 4.3 g/dL (ref 3.8–4.8)
Alkaline Phosphatase: 118 [IU]/L (ref 44–121)
BUN/Creatinine Ratio: 12 (ref 12–28)
BUN: 15 mg/dL (ref 8–27)
Bilirubin Total: 0.3 mg/dL (ref 0.0–1.2)
CO2: 20 mmol/L (ref 20–29)
Calcium: 9.1 mg/dL (ref 8.7–10.3)
Chloride: 106 mmol/L (ref 96–106)
Creatinine, Ser: 1.28 mg/dL — ABNORMAL HIGH (ref 0.57–1.00)
Globulin, Total: 2.6 g/dL (ref 1.5–4.5)
Glucose: 148 mg/dL — ABNORMAL HIGH (ref 70–99)
Potassium: 4.8 mmol/L (ref 3.5–5.2)
Sodium: 143 mmol/L (ref 134–144)
Total Protein: 6.9 g/dL (ref 6.0–8.5)
eGFR: 43 mL/min/{1.73_m2} — ABNORMAL LOW (ref >59)

## 2023-06-30 LAB — TSH: TSH: 2.75 u[IU]/mL (ref 0.450–4.500)

## 2023-07-06 ENCOUNTER — Ambulatory Visit (INDEPENDENT_AMBULATORY_CARE_PROVIDER_SITE_OTHER): Payer: Medicare Other | Admitting: Nurse Practitioner

## 2023-07-06 ENCOUNTER — Encounter: Payer: Self-pay | Admitting: Nurse Practitioner

## 2023-07-06 VITALS — BP 144/88 | HR 98 | Ht 64.0 in | Wt 194.6 lb

## 2023-07-06 DIAGNOSIS — E782 Mixed hyperlipidemia: Secondary | ICD-10-CM | POA: Diagnosis not present

## 2023-07-06 DIAGNOSIS — E039 Hypothyroidism, unspecified: Secondary | ICD-10-CM | POA: Diagnosis not present

## 2023-07-06 DIAGNOSIS — N1832 Chronic kidney disease, stage 3b: Secondary | ICD-10-CM

## 2023-07-06 DIAGNOSIS — Z7984 Long term (current) use of oral hypoglycemic drugs: Secondary | ICD-10-CM

## 2023-07-06 DIAGNOSIS — E1122 Type 2 diabetes mellitus with diabetic chronic kidney disease: Secondary | ICD-10-CM

## 2023-07-06 DIAGNOSIS — Z7985 Long-term (current) use of injectable non-insulin antidiabetic drugs: Secondary | ICD-10-CM

## 2023-07-06 DIAGNOSIS — N1831 Chronic kidney disease, stage 3a: Secondary | ICD-10-CM

## 2023-07-06 DIAGNOSIS — E559 Vitamin D deficiency, unspecified: Secondary | ICD-10-CM

## 2023-07-06 DIAGNOSIS — I10 Essential (primary) hypertension: Secondary | ICD-10-CM | POA: Diagnosis not present

## 2023-07-06 LAB — POCT GLYCOSYLATED HEMOGLOBIN (HGB A1C): Hemoglobin A1C: 6.7 % — AB (ref 4.0–5.6)

## 2023-07-06 MED ORDER — LEVOTHYROXINE SODIUM 75 MCG PO TABS: 75.0000 ug | ORAL_TABLET | Freq: Every day | ORAL | 3 refills | Status: DC

## 2023-07-06 MED ORDER — TIRZEPATIDE 7.5 MG/0.5ML ~~LOC~~ SOAJ: 7.5000 mg | SUBCUTANEOUS | 1 refills | Status: DC

## 2023-07-06 MED ORDER — OMEPRAZOLE 20 MG PO CPDR: 20.0000 mg | DELAYED_RELEASE_CAPSULE | Freq: Every day | ORAL | 3 refills | Status: DC

## 2023-07-06 NOTE — Progress Notes (Signed)
 07/06/2023, 11:40 AM          Endocrinology follow-up note   Subjective:    Patient ID: Carol Adams, female    DOB: 1945/08/10.  Carol Adams is being seen in follow-up for management of currently uncontrolled symptomatic type 2 diabetes, hypothyroidism, hyperlipidemia, hypertension.  She is accompanied by her husband today.  PMD:   Lonna Millman, DO.   Past Medical History:  Diagnosis Date   Diabetes mellitus, type II (HCC)    Hypothyroidism    Past Surgical History:  Procedure Laterality Date   ABDOMINAL HYSTERECTOMY     CHOLECYSTECTOMY     Social History   Socioeconomic History   Marital status: Married    Spouse name: Not on file   Number of children: Not on file   Years of education: Not on file   Highest education level: Not on file  Occupational History   Not on file  Tobacco Use   Smoking status: Never   Smokeless tobacco: Never  Vaping Use   Vaping status: Never Used  Substance and Sexual Activity   Alcohol use: Never   Drug use: Never   Sexual activity: Not on file  Other Topics Concern   Not on file  Social History Narrative   Not on file   Social Drivers of Health   Financial Resource Strain: Not on file  Food Insecurity: Not on file  Transportation Needs: Not on file  Physical Activity: Not on file  Stress: Not on file  Social Connections: Not on file   Outpatient Encounter Medications as of 07/06/2023  Medication Sig   Accu-Chek FastClix Lancets MISC USE 1 TO 2 TIMES DAILY   cholecalciferol (VITAMIN D3) 25 MCG (1000 UNIT) tablet Take 5,000 Units by mouth daily.   glucose blood (ACCU-CHEK GUIDE) test strip Use as instructed to monitor glucose twice daily   loratadine (CLARITIN) 10 MG tablet Take 10 mg by mouth daily.   Multiple Vitamin (MULTIVITAMIN) capsule Take 1 capsule by mouth daily.   PARoxetine (PAXIL) 20 MG tablet Take 20 mg by mouth daily.   tirzepatide  (MOUNJARO ) 7.5 MG/0.5ML Pen Inject 7.5 mg into the skin  once a week.   trandolapril (MAVIK) 4 MG tablet daily as needed.   [DISCONTINUED] levothyroxine  (SYNTHROID ) 75 MCG tablet Take 1 tablet (75 mcg total) by mouth daily before breakfast.   [DISCONTINUED] MOUNJARO  5 MG/0.5ML Pen Inject 5 mg into the skin once a week.   [DISCONTINUED] omeprazole  (PRILOSEC) 20 MG capsule TAKE 1 CAPSULE BY MOUTH EVERY DAY   levothyroxine  (SYNTHROID ) 75 MCG tablet Take 1 tablet (75 mcg total) by mouth daily before breakfast.   omeprazole  (PRILOSEC) 20 MG capsule Take 1 capsule (20 mg total) by mouth daily.   No facility-administered encounter medications on file as of 07/06/2023.    ALLERGIES: Allergies  Allergen Reactions   Cephalexin Rash    VACCINATION STATUS: Immunization History  Administered Date(s) Administered   PFIZER(Purple Top)SARS-COV-2 Vaccination 09/16/2019, 10/07/2019    Diabetes She presents for her follow-up diabetic visit. She has type 2 diabetes mellitus. Onset time: She was recently diagnosed with type 2 diabetes at age 78 years. Her disease course has been stable. There are no hypoglycemic associated symptoms. Pertinent negatives for hypoglycemia include no confusion, headaches, pallor or seizures. Pertinent negatives for diabetes include no polydipsia, no polyphagia, no polyuria and no weight loss. There are no hypoglycemic complications. Symptoms are stable. Diabetic complications include nephropathy. Risk factors for  coronary artery disease include diabetes mellitus, dyslipidemia, hypertension, obesity, sedentary lifestyle and post-menopausal. Current diabetic treatments: Mounjaro  only. She is compliant with treatment all of the time. Her weight is increasing steadily. She is following a diabetic diet. When asked about meal planning, she reported none. She has not had a previous visit with a dietitian. She participates in exercise intermittently. Her home blood glucose trend is fluctuating minimally. Her overall blood glucose range is 130-140  mg/dl. (She presents today, accompanied by her husband, with her meter and logs showing at target glycemic profile overall.  Her POCT A1c today is 6.7%, increasing from last visit of 6.2%.  Analysis of her meter shows 7-day average of 133, 14-day average of 132, 30-day average of 133, 90-day average of 126.  She has tolerated the Mounjaro  well, ready to increase to the next dose.  She did have sinus issues since last visit, requiring antibiotics.  She admits to indulging in sweets during the holiday season. ) An ACE inhibitor/angiotensin II receptor blocker is not being taken. She does not see a podiatrist.Eye exam is current.  Hypertension This is a chronic problem. The current episode started more than 1 year ago. The problem has been waxing and waning since onset. The problem is uncontrolled. Pertinent negatives include no headaches or palpitations. Agents associated with hypertension include thyroid  hormones. Risk factors for coronary artery disease include diabetes mellitus, dyslipidemia, sedentary lifestyle, obesity and post-menopausal state. Past treatments include nothing. Compliance problems include diet and exercise.  Hypertensive end-organ damage includes kidney disease. Identifiable causes of hypertension include a thyroid  problem.   Review of systems  Constitutional: + Minimally fluctuating body weight,  current Body mass index is 33.4 kg/m. , no fatigue, no subjective hyperthermia, no subjective hypothermia Eyes: no blurry vision, no xerophthalmia ENT: no sore throat, no nodules palpated in throat, no dysphagia/odynophagia, no hoarseness Cardiovascular: no chest pain, no shortness of breath, no palpitations, no leg swelling Respiratory: no cough, no shortness of breath Gastrointestinal: no nausea/vomiting/diarrhea Musculoskeletal: no muscle/joint aches Skin: no rashes, no hyperemia Neurological: no tremors, no numbness, no tingling, no dizziness Psychiatric: no depression, no  anxiety   Objective:    BP (!) 144/88 (BP Location: Right Arm, Patient Position: Sitting) Comment: Retake manuel cuff, patient has taken medication this morning. She folloew Vernell Brain for her ARTEMIO Folks made aware  Pulse 98   Ht 5' 4 (1.626 m)   Wt 194 lb 9.6 oz (88.3 kg)   BMI 33.40 kg/m   Wt Readings from Last 3 Encounters:  07/06/23 194 lb 9.6 oz (88.3 kg)  03/05/23 186 lb 3.2 oz (84.5 kg)  11/02/22 188 lb 9.6 oz (85.5 kg)    BP Readings from Last 3 Encounters:  07/06/23 (!) 144/88  03/05/23 137/70  11/02/22 134/64     Physical Exam- Limited  Constitutional:  Body mass index is 33.4 kg/m. , not in acute distress, normal state of mind Eyes:  EOMI, no exophthalmos Musculoskeletal: no gross deformities, strength intact in all four extremities, no gross restriction of joint movements Skin:  no rashes, no hyperemia Neurological: no tremor with outstretched hands   Diabetic Foot Exam - Simple   No data filed     Recent Results (from the past 2160 hours)  Comprehensive metabolic panel     Status: Abnormal   Collection Time: 06/29/23  9:08 AM  Result Value Ref Range   Glucose 148 (H) 70 - 99 mg/dL   BUN 15 8 - 27 mg/dL  Creatinine, Ser 1.28 (H) 0.57 - 1.00 mg/dL   eGFR 43 (L) >40 fO/fpw/8.26   BUN/Creatinine Ratio 12 12 - 28   Sodium 143 134 - 144 mmol/L   Potassium 4.8 3.5 - 5.2 mmol/L   Chloride 106 96 - 106 mmol/L   CO2 20 20 - 29 mmol/L   Calcium  9.1 8.7 - 10.3 mg/dL   Total Protein 6.9 6.0 - 8.5 g/dL   Albumin 4.3 3.8 - 4.8 g/dL   Globulin, Total 2.6 1.5 - 4.5 g/dL   Bilirubin Total 0.3 0.0 - 1.2 mg/dL   Alkaline Phosphatase 118 44 - 121 IU/L   AST 20 0 - 40 IU/L   ALT 16 0 - 32 IU/L  TSH     Status: None   Collection Time: 06/29/23  9:08 AM  Result Value Ref Range   TSH 2.750 0.450 - 4.500 uIU/mL  T4, free     Status: None   Collection Time: 06/29/23  9:08 AM  Result Value Ref Range   Free T4 1.43 0.82 - 1.77 ng/dL  HgB J8r     Status:  Abnormal   Collection Time: 07/06/23 11:21 AM  Result Value Ref Range   Hemoglobin A1C 6.7 (A) 4.0 - 5.6 %   HbA1c POC (<> result, manual entry)     HbA1c, POC (prediabetic range)     HbA1c, POC (controlled diabetic range)        Assessment & Plan:   1) 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    She presents today, accompanied by her husband, with her meter and logs showing at target glycemic profile overall.  Her POCT A1c today is 6.7%, increasing from last visit of 6.2%.  Analysis of her meter shows 7-day average of 133, 14-day average of 132, 30-day average of 133, 90-day average of 126.  She has tolerated the Mounjaro  well, ready to increase to the next dose.  She did have sinus issues since last visit, requiring antibiotics.  She admits to indulging in sweets during the holiday season.  Her recent labs reviewed and shows declining kidney function.  Will recheck prior to next visit.  -her diabetes is complicated by stage 3 renal insufficiency,obesity/sedentary life and she remains at a high risk for more acute and chronic complications which include CAD, CVA, CKD, retinopathy, and neuropathy. These are all discussed in detail with her.  - Nutritional counseling repeated at each appointment due to patients tendency to fall back in to old habits.  - The patient admits there is a room for improvement in their diet and drink choices. -  Suggestion is made for the patient to avoid simple carbohydrates from their diet including Cakes, Sweet Desserts / Pastries, Ice Cream, Soda (diet and regular), Sweet Tea, Candies, Chips, Cookies, Sweet Pastries, Store Bought Juices, Alcohol in Excess of 1-2 drinks a day, Artificial Sweeteners, Coffee Creamer, and Sugar-free Products. This will help patient to have stable blood glucose profile and potentially avoid unintended weight gain.    - I encouraged the patient to switch to unprocessed or minimally processed complex starch and increased protein intake (animal or plant source), fruits, and vegetables.   - Patient is advised to stick to a routine mealtimes to eat 3 meals a day and avoid unnecessary snacks (to snack only to correct hypoglycemia).  - I have approached her with the following individualized plan to  manage diabetes and patient agrees:   -She is advised to increase Mounjaro  to 7.5 mg SQ weekly.    -She is advised to continue monitoring blood sugar at least once a day (2-3 times per week), and as needed if symptoms arise, and to call the clinic if she has readings less than 70 or greater than 200 for 3 tests in a row.  - she is not a candidate for SGLT2 inhibitors due to CKD.  2) BP/HTN:  Her blood pressure is slightly elevated today.  She is not currently on any antihypertensive medications at this time.  She does monitor BP at home routinely, and reports she typically has good results less than 140/90.    3) Hyperlipidemia Her most recent lipid panel from 06/08/22 shows controlled LDL of 13.  She is advised to continue Pravastatin 20 mg po daily.  4) Hypothyroidism-long-term diagnosis, took levothyroxine  for more than 10 years.  -Her previsit TFTs are consistent with appropriate hormone replacement.  She is advised to continue Levothyroxine  75 mcg po daily before breakfast.     - We discussed about the correct intake of her thyroid  hormone, on empty stomach at fasting, with water, separated by at least 30 minutes from breakfast and other medications,  and separated by more than 4 hours from calcium , iron, multivitamins, acid reflux medications (PPIs). -Patient is made aware of the fact that thyroid  hormone replacement is needed for life, dose to be adjusted by periodic monitoring of thyroid  function tests.  5) Chronic Care/Health Maintenance: -she is encouraged to initiate and continue to follow up with  Ophthalmology, Dentist,  Podiatrist at least yearly or according to recommendations, and advised to  stay away from smoking. I have recommended yearly flu vaccine and pneumonia vaccine at least every 5 years; moderate intensity exercise for up to 150 minutes weekly; and  sleep for at least 7 hours a day.  - I advised patient to maintain close follow up with Lonna Millman, DO for primary care needs.     I spent  41  minutes in the care of the patient today including review of labs from CMP, Lipids, Thyroid  Function, Hematology (current and previous including abstractions from other facilities); face-to-face time discussing  her blood glucose readings/logs, discussing hypoglycemia and hyperglycemia episodes and symptoms, medications doses, her options of short and long term treatment based on the latest standards of care / guidelines;  discussion about incorporating lifestyle medicine;  and documenting the encounter. Risk reduction counseling performed per USPSTF guidelines to reduce obesity and cardiovascular risk factors.     Please refer to Patient Instructions for Blood Glucose Monitoring and Insulin/Medications Dosing Guide  in media tab for additional information. Please  also refer to  Patient Self Inventory in the Media  tab for reviewed elements of pertinent patient history.  Carol Adams participated in the discussions, expressed understanding, and voiced agreement with the above plans.  All questions were answered to her satisfaction. she is encouraged to contact clinic should she have any questions or concerns prior to her return visit.   Follow up plan: - Return in about 4 months (around 11/03/2023) for Diabetes F/U with A1c in office, Thyroid  follow up, Previsit labs.  Benton Rio, [facility] [address] [place], [place] [address] Phone: [phone] Fax: [phone]   07/06/2023, 11:40 AM

## 2023-07-08 ENCOUNTER — Other Ambulatory Visit: Payer: Self-pay | Admitting: *Deleted

## 2023-07-08 DIAGNOSIS — Z7984 Long term (current) use of oral hypoglycemic drugs: Secondary | ICD-10-CM

## 2023-07-08 DIAGNOSIS — E1122 Type 2 diabetes mellitus with diabetic chronic kidney disease: Secondary | ICD-10-CM

## 2023-07-08 DIAGNOSIS — Z7985 Long-term (current) use of injectable non-insulin antidiabetic drugs: Secondary | ICD-10-CM

## 2023-07-08 MED ORDER — TIRZEPATIDE 7.5 MG/0.5ML ~~LOC~~ SOAJ: 7.5000 mg | SUBCUTANEOUS | 1 refills | Status: DC

## 2023-07-08 NOTE — Telephone Encounter (Signed)
 Patient left a message that [facility] did not have the Mounjaro  7.5 mg. She is asking that a prescription be sent to the [facility]  on [address] in [place]. Patient called and made aware. I shared that if they did not have it to ask what milligram they may have and I would address with Whitney about sending in a prescription for that dose.

## 2023-07-12 ENCOUNTER — Other Ambulatory Visit (HOSPITAL_COMMUNITY): Payer: Self-pay

## 2023-07-12 ENCOUNTER — Telehealth: Payer: Self-pay

## 2023-07-12 ENCOUNTER — Telehealth: Payer: Self-pay | Admitting: *Deleted

## 2023-07-12 NOTE — Telephone Encounter (Signed)
 Pharmacy Patient Advocate Encounter   Received notification from Pt Calls Messages that prior authorization for Mounjaro  is required/requested.   Insurance verification completed.   The patient is insured through [facility] .   Per test claim: PA required; PA submitted to above mentioned insurance via CoverMyMeds Key/confirmation #/EOC [id number] Status is pending

## 2023-07-12 NOTE — Telephone Encounter (Signed)
 Patient has left a message stating that per her pharmacy she needs to have a PA for her Mounjaro . The PA number is [id number], per the patient. Patient was called and made aware that this was going to be sent over to the Prior RX Authorization team, to see if they have received it and if they can start working on this for the patient.

## 2023-07-13 ENCOUNTER — Other Ambulatory Visit: Payer: Self-pay | Admitting: Nurse Practitioner

## 2023-07-13 DIAGNOSIS — E1122 Type 2 diabetes mellitus with diabetic chronic kidney disease: Secondary | ICD-10-CM

## 2023-07-20 NOTE — Telephone Encounter (Signed)
Pharmacy Patient Advocate Encounter  Received notification from [facility] that Prior Authorization for Carol Adams has been APPROVED through 06/28/2024   PA #/Case ID/Reference #: [id number]

## 2023-07-20 NOTE — Telephone Encounter (Signed)
Patient was called and made aware. 

## 2023-07-22 ENCOUNTER — Other Ambulatory Visit: Payer: Self-pay | Admitting: Nurse Practitioner

## 2023-07-22 DIAGNOSIS — Z7984 Long term (current) use of oral hypoglycemic drugs: Secondary | ICD-10-CM

## 2023-07-22 DIAGNOSIS — Z7985 Long-term (current) use of injectable non-insulin antidiabetic drugs: Secondary | ICD-10-CM

## 2023-07-22 DIAGNOSIS — E1122 Type 2 diabetes mellitus with diabetic chronic kidney disease: Secondary | ICD-10-CM

## 2023-07-26 ENCOUNTER — Other Ambulatory Visit: Payer: Self-pay | Admitting: *Deleted

## 2023-07-26 DIAGNOSIS — Z7985 Long-term (current) use of injectable non-insulin antidiabetic drugs: Secondary | ICD-10-CM

## 2023-07-26 DIAGNOSIS — Z7984 Long term (current) use of oral hypoglycemic drugs: Secondary | ICD-10-CM

## 2023-07-26 DIAGNOSIS — N1832 Chronic kidney disease, stage 3b: Secondary | ICD-10-CM

## 2023-07-26 MED ORDER — TIRZEPATIDE 7.5 MG/0.5ML ~~LOC~~ SOAJ: 7.5000 mg | SUBCUTANEOUS | 1 refills | Status: DC

## 2023-08-23 ENCOUNTER — Other Ambulatory Visit (HOSPITAL_COMMUNITY): Payer: Self-pay

## 2023-10-16 IMAGING — CT CT ABD-PELV W/O CM
2 of 4 series · 15 of 46 positions shown, 17 images · non-contrast
Comparison: None

CLINICAL DATA: 75-year-old female presents for evaluation of
abdominal pain that started on [REDACTED] the, RIGHT flank and groin
pain.



[Series 2: axial st · axial · 0.69mm/px · z∈[+1008,+1394]mm · 12 of 89 slices shown, 14 images]
[im 6/89  soft-tissue]
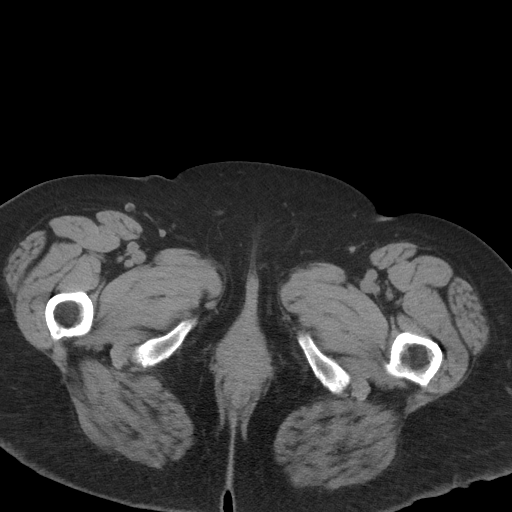
[im 6/89  bone]
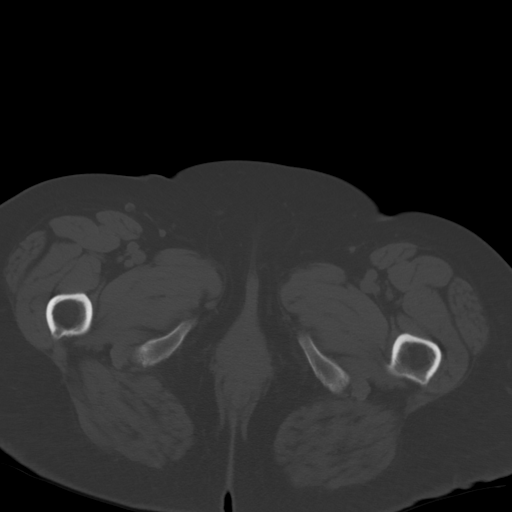
[im 12/89  soft-tissue]
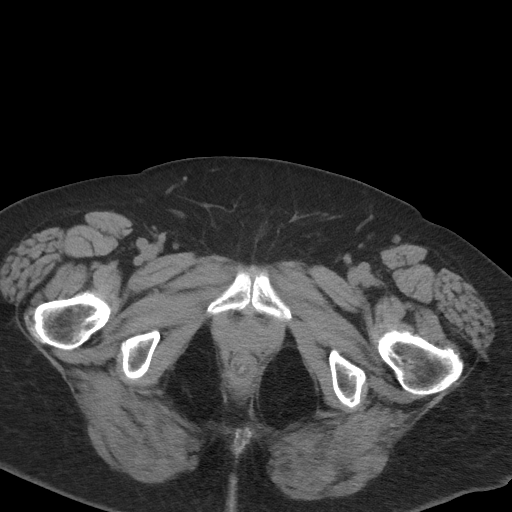
[im 18/89  soft-tissue]
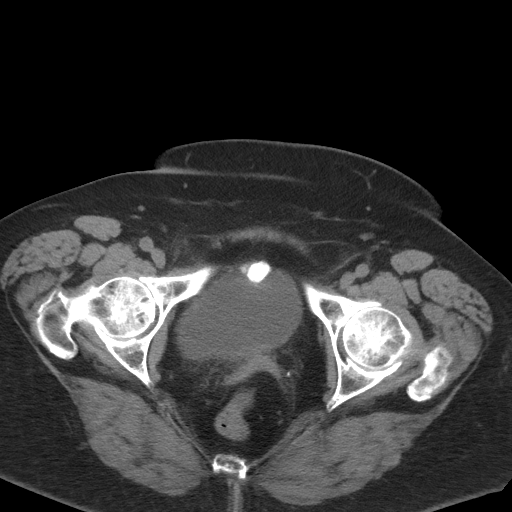
[im 30/89  soft-tissue]
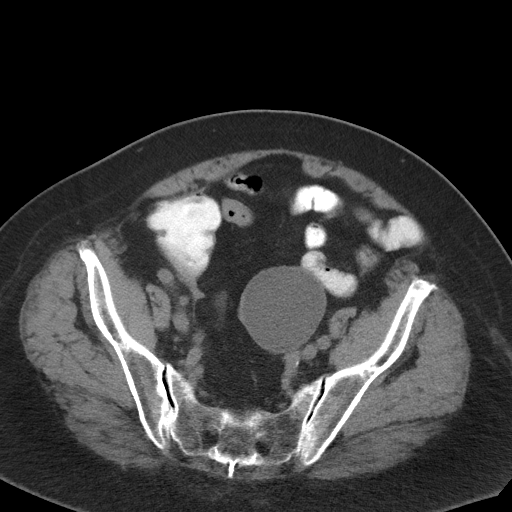
[im 36/89  soft-tissue]
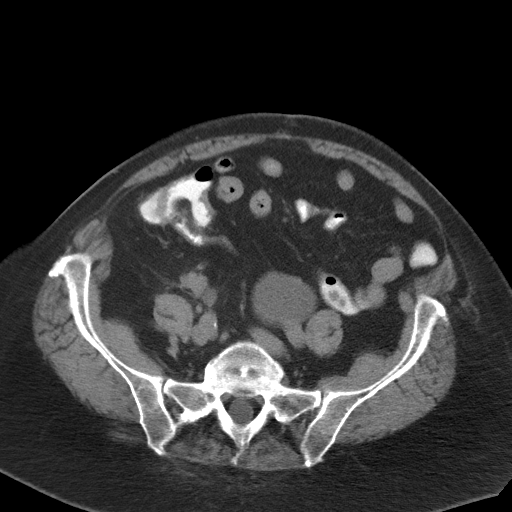
[im 42/89  soft-tissue]
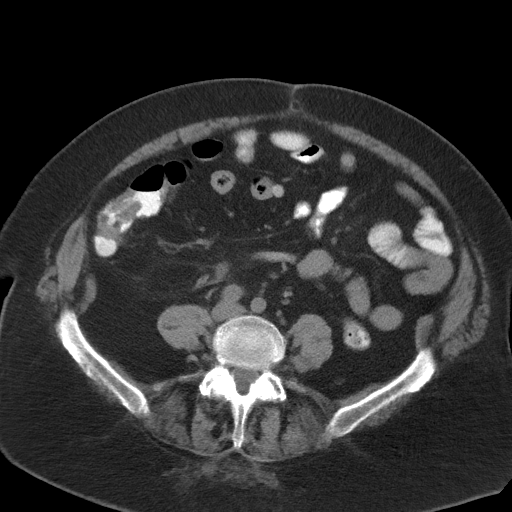
[im 47/89  soft-tissue]
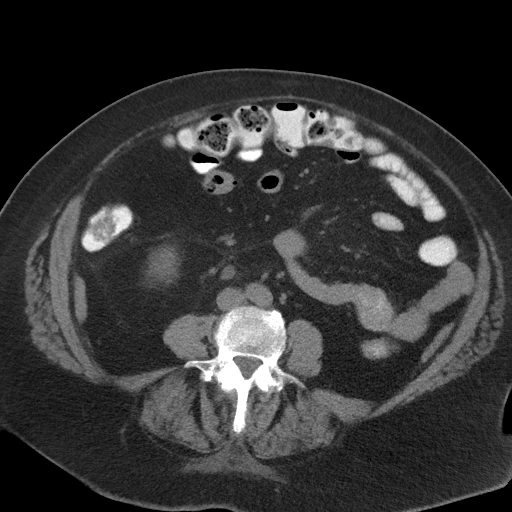
[im 53/89  soft-tissue]
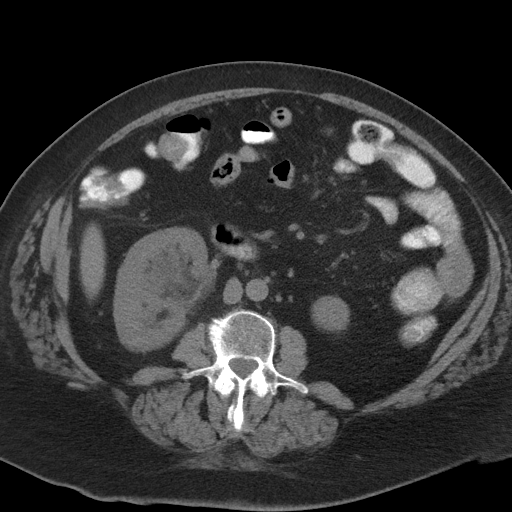
[im 59/89  soft-tissue]
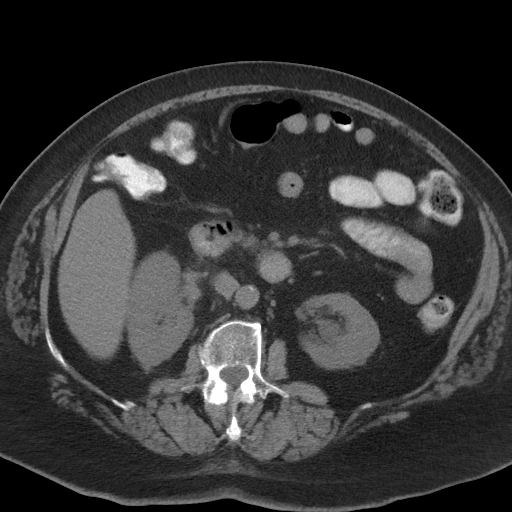
[im 59/89  bone]
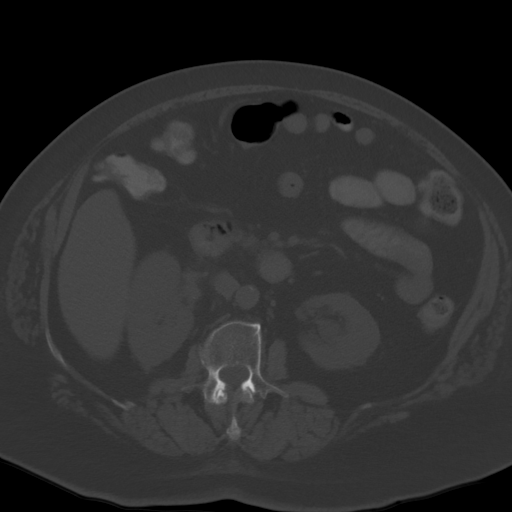
[im 71/89  soft-tissue]
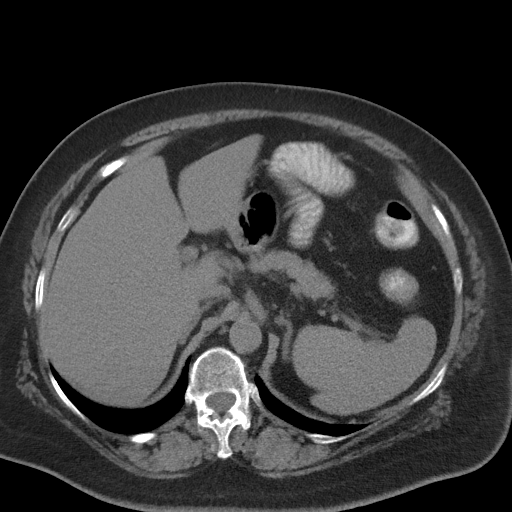
[im 77/89  soft-tissue]
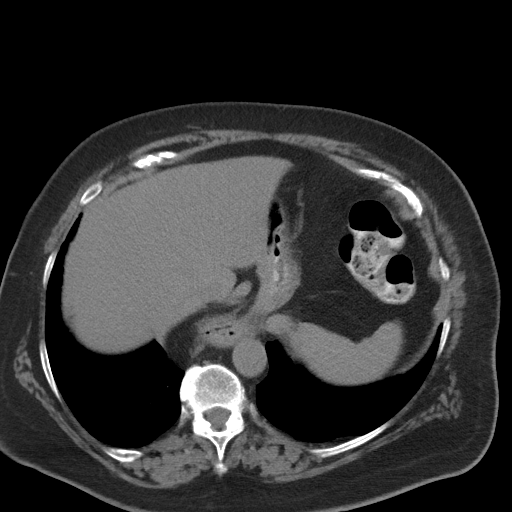
[im 83/89  soft-tissue]
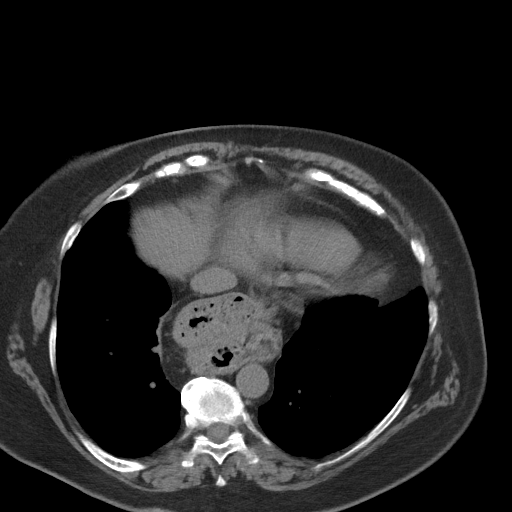

[Series 5: coronal st · coronal · 0.77mm/px · 3 of 100 slices shown]
[im 34/100  soft-tissue]
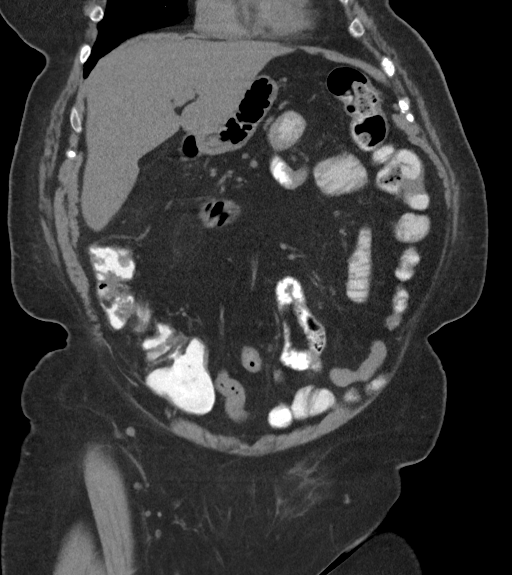
[im 45/100  soft-tissue]
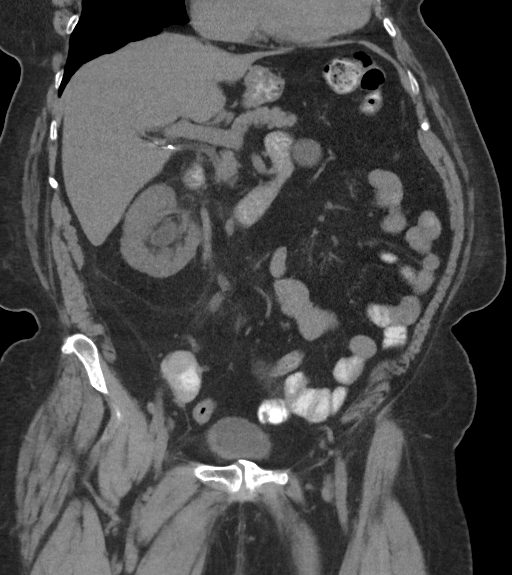
[im 56/100  soft-tissue]
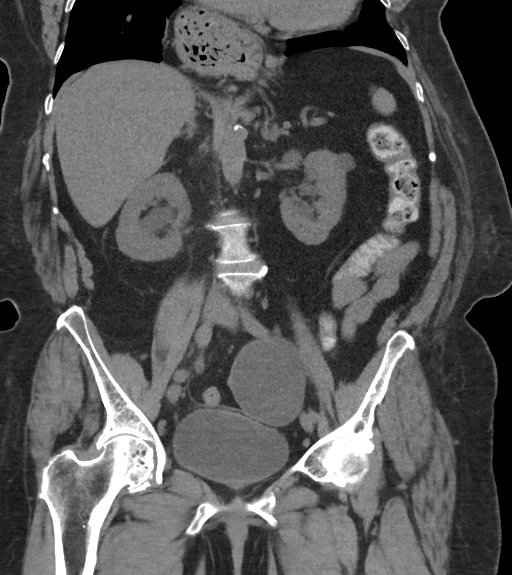

[15 of 46 positions shown; findings below may reference images not displayed]

FINDINGS: Lower chest: No effusion. No consolidation. Moderately large hiatal
hernia, greater than 50% of the stomach herniated into the chest.
Changes of LEFT mastectomy are incidentally noted and incompletely
evaluated.

Hepatobiliary: Mild hepatic steatosis with smooth hepatic contours.
Post cholecystectomy without gross biliary duct distension.

Pancreas: Mild pancreatic atrophy without signs of inflammation.

Spleen: Normal.

Adrenals/Urinary Tract: Signs of mild to moderate RIGHT-sided
hydroureteronephrosis with moderate perinephric stranding. No focal
fluid. The ureteral dilation to the level of the RIGHT UVJ where
there is a 5 mm RIGHT UVJ calculus. Urinary bladder without
substantial inflammation. No LEFT ureteral calculus. The cysts of
the LEFT kidney with signs of renal cortical scarring on the LEFT as
well. There is however arising from the or near the anterior aspect
of the LEFT kidney a ovoid mixed density structure that is
well-circumscribed and without surrounding stranding showing near
water density without definitive origin from the LEFT kidney. This
measures 3.9 x 3.5 cm.

Undo that

Stomach/Bowel: Large hiatal hernia as discussed. No stranding
adjacent to small bowel loops. No sign of small bowel obstruction.
Normal appendix.

Vascular/Lymphatic:

Aortic atherosclerosis. No sign of aneurysm. Smooth contour of the
IVC. There is no gastrohepatic or hepatoduodenal ligament
lymphadenopathy. No retroperitoneal or mesenteric lymphadenopathy.

No pelvic sidewall lymphadenopathy.

Vascular structures with limited assessment due to lack of
intravenous contrast.

Reproductive: Post hysterectomy. LEFT adnexal cystic structure
measuring 5.8 x 5.5 x 8.0 cm not well evaluated given lack of
intravenous contrast.

Other: No ascites.

Musculoskeletal: Osteopenia.  No acute or destructive bone process.
IMPRESSION: 1. 5 mm RIGHT UVJ calculus with signs of obstruction as discussed.
2. LEFT adnexal cystic structure measuring up to 8.0 cm not well
evaluated given lack of intravenous contrast. Because this lesion is
not adequately characterized, prompt US is recommended for further
evaluation. Note: This recommendation does not apply to premenarchal
patients and to those with increased risk (genetic, family history,
elevated tumor markers or other high-risk factors) of ovarian
cancer. Reference: JACR [DATE]):[id number]
3. Cystic area adjacent to the LEFT kidney but not definitely
arising from the LEFT kidney measures water density and is
potentially a lesion such as a retroperitoneal lymphangioma but is
not well evaluated. Consider nonemergent short interval follow-up
after resolution of acute symptoms with contrasted CT imaging to
assess for any internal complexity.
4. Moderately large hiatal hernia, greater than 50% of the stomach
herniated into the chest.
5. Mild hepatic steatosis.
6. Post cholecystectomy without gross biliary duct distension.
7. Aortic atherosclerosis.

Aortic Atherosclerosis ([id number]).

## 2023-10-26 LAB — T4, FREE: Free T4: 1.42 ng/dL (ref 0.82–1.77)

## 2023-10-26 LAB — COMPREHENSIVE METABOLIC PANEL WITH GFR
ALT: 20 IU/L (ref 0–32)
AST: 22 IU/L (ref 0–40)
Albumin: 4.2 g/dL (ref 3.8–4.8)
Alkaline Phosphatase: 114 IU/L (ref 44–121)
BUN/Creatinine Ratio: 9 — ABNORMAL LOW (ref 12–28)
BUN: 12 mg/dL (ref 8–27)
Bilirubin Total: 0.3 mg/dL (ref 0.0–1.2)
CO2: 21 mmol/L (ref 20–29)
Calcium: 8.9 mg/dL (ref 8.7–10.3)
Chloride: 109 mmol/L — ABNORMAL HIGH (ref 96–106)
Creatinine, Ser: 1.3 mg/dL — ABNORMAL HIGH (ref 0.57–1.00)
Globulin, Total: 2.6 g/dL (ref 1.5–4.5)
Glucose: 140 mg/dL — ABNORMAL HIGH (ref 70–99)
Potassium: 4.9 mmol/L (ref 3.5–5.2)
Sodium: 144 mmol/L (ref 134–144)
Total Protein: 6.8 g/dL (ref 6.0–8.5)
eGFR: 42 mL/min/{1.73_m2} — ABNORMAL LOW (ref >59)

## 2023-10-26 LAB — TSH: TSH: 1.6 u[IU]/mL (ref 0.450–4.500)

## 2023-11-03 ENCOUNTER — Ambulatory Visit (INDEPENDENT_AMBULATORY_CARE_PROVIDER_SITE_OTHER): Payer: Medicare Other | Admitting: Nurse Practitioner

## 2023-11-03 ENCOUNTER — Encounter: Payer: Self-pay | Admitting: Nurse Practitioner

## 2023-11-03 VITALS — BP 124/68 | HR 92 | Ht 64.0 in | Wt 192.2 lb

## 2023-11-03 DIAGNOSIS — E559 Vitamin D deficiency, unspecified: Secondary | ICD-10-CM | POA: Diagnosis not present

## 2023-11-03 DIAGNOSIS — E1122 Type 2 diabetes mellitus with diabetic chronic kidney disease: Secondary | ICD-10-CM | POA: Diagnosis not present

## 2023-11-03 DIAGNOSIS — E039 Hypothyroidism, unspecified: Secondary | ICD-10-CM

## 2023-11-03 DIAGNOSIS — Z7984 Long term (current) use of oral hypoglycemic drugs: Secondary | ICD-10-CM | POA: Diagnosis not present

## 2023-11-03 DIAGNOSIS — N1831 Chronic kidney disease, stage 3a: Secondary | ICD-10-CM

## 2023-11-03 DIAGNOSIS — Z7985 Long-term (current) use of injectable non-insulin antidiabetic drugs: Secondary | ICD-10-CM | POA: Diagnosis not present

## 2023-11-03 DIAGNOSIS — E782 Mixed hyperlipidemia: Secondary | ICD-10-CM

## 2023-11-03 DIAGNOSIS — N1832 Chronic kidney disease, stage 3b: Secondary | ICD-10-CM

## 2023-11-03 DIAGNOSIS — I10 Essential (primary) hypertension: Secondary | ICD-10-CM

## 2023-11-03 LAB — POCT UA - MICROALBUMIN
Creatinine, POC: 300 mg/dL
Microalbumin Ur, POC: 80 mg/L

## 2023-11-03 LAB — POCT GLYCOSYLATED HEMOGLOBIN (HGB A1C): Hemoglobin A1C: 6.4 % — AB (ref 4.0–5.6)

## 2023-11-03 MED ORDER — ACCU-CHEK GUIDE TEST VI STRP: ORAL_STRIP | 12 refills | Status: AC

## 2023-11-03 MED ORDER — TIRZEPATIDE 10 MG/0.5ML ~~LOC~~ SOAJ: 10.0000 mg | SUBCUTANEOUS | 1 refills | Status: DC

## 2023-11-03 MED ORDER — ACCU-CHEK FASTCLIX LANCETS MISC
3 refills | Status: AC
Start: 2023-11-03 — End: ?

## 2023-11-03 NOTE — Progress Notes (Signed)
 11/03/2023, 11:17 AM          Endocrinology follow-up note   Subjective:    Patient ID: Carol Adams, female    DOB: 1945-09-22.  Carol Adams is being seen in follow-up for management of currently uncontrolled symptomatic type 2 diabetes, hypothyroidism, hyperlipidemia, hypertension.  She is accompanied by her husband today.  PMD:   Ava Lei, DO.   Past Medical History:  Diagnosis Date   Diabetes mellitus, type II (HCC)    Hypothyroidism    Past Surgical History:  Procedure Laterality Date   ABDOMINAL HYSTERECTOMY     CHOLECYSTECTOMY     Social History   Socioeconomic History   Marital status: Married    Spouse name: Not on file   Number of children: Not on file   Years of education: Not on file   Highest education level: Not on file  Occupational History   Not on file  Tobacco Use   Smoking status: Never   Smokeless tobacco: Never  Vaping Use   Vaping status: Never Used  Substance and Sexual Activity   Alcohol use: Never   Drug use: Never   Sexual activity: Not on file  Other Topics Concern   Not on file  Social History Narrative   Not on file   Social Drivers of Health   Financial Resource Strain: Not on file  Food Insecurity: Not on file  Transportation Needs: Not on file  Physical Activity: Not on file  Stress: Not on file  Social Connections: Not on file   Outpatient Encounter Medications as of 11/03/2023  Medication Sig   cholecalciferol (VITAMIN D3) 25 MCG (1000 UNIT) tablet Take 5,000 Units by mouth daily.   glucose blood (ACCU-CHEK GUIDE TEST) test strip Use as instructed to monitor glucose once daily   levothyroxine  (SYNTHROID ) 75 MCG tablet Take 1 tablet (75 mcg total) by mouth daily before breakfast.   loratadine (CLARITIN) 10 MG tablet Take 10 mg by mouth daily.   Multiple Vitamin (MULTIVITAMIN) capsule Take 1 capsule by mouth daily.   PARoxetine (PAXIL) 20 MG tablet Take 20 mg by mouth daily.   tirzepatide   (MOUNJARO ) 10 MG/0.5ML Pen Inject 10 mg into the skin once a week.   trandolapril (MAVIK) 4 MG tablet daily as needed.   [DISCONTINUED] Accu-Chek FastClix Lancets MISC USE 1 TO 2 LANCETS TWICE DAILY   [DISCONTINUED] glucose blood (ACCU-CHEK GUIDE) test strip Use as instructed to monitor glucose twice daily   [DISCONTINUED] tirzepatide  (MOUNJARO ) 7.5 MG/0.5ML Pen Inject 7.5 mg into the skin once a week.   Accu-Chek FastClix Lancets MISC USE 1 TO 2 LANCETS TWICE DAILY   omeprazole  (PRILOSEC) 20 MG capsule Take 1 capsule (20 mg total) by mouth daily.   No facility-administered encounter medications on file as of 11/03/2023.    ALLERGIES: Allergies  Allergen Reactions   Cephalexin Rash    VACCINATION STATUS: Immunization History  Administered Date(s) Administered   PFIZER(Purple Top)SARS-COV-2 Vaccination 09/16/2019, 10/07/2019    Diabetes She presents for her follow-up diabetic visit. She has type 2 diabetes mellitus. Onset time: She was recently diagnosed with type 2 diabetes at age 78 years. Her disease course has been improving. There are no hypoglycemic associated symptoms. Pertinent negatives for hypoglycemia include no confusion, headaches, pallor or seizures. Pertinent negatives for diabetes include no polydipsia, no polyphagia, no polyuria and no weight loss. There are no hypoglycemic complications. Symptoms are stable. Diabetic complications include nephropathy. Risk factors for  coronary artery disease include diabetes mellitus, dyslipidemia, hypertension, obesity, sedentary lifestyle and post-menopausal. Current diabetic treatments: Mounjaro  only. She is compliant with treatment all of the time. Her weight is decreasing steadily. She is following a diabetic diet. When asked about meal planning, she reported none. She has not had a previous visit with a dietitian. She participates in exercise intermittently. Her home blood glucose trend is decreasing steadily. Her overall blood glucose  range is 110-130 mg/dl. (She presents today, accompanied by her husband, with her meter and logs showing at target glycemic profile overall.  Her POCT A1c today is 6.4%, improving from last visit of 6.7%.  Analysis of her meter shows 7-day average of 115, 14-day average of 118, 30-day average of 116, 90-day average of 119.  She has tolerated the Mounjaro  well, ready to increase to the next dose.  ) An ACE inhibitor/angiotensin II receptor blocker is not being taken. She does not see a podiatrist.Eye exam is current.  Hypertension This is a chronic problem. The current episode started more than 1 year ago. The problem has been waxing and waning since onset. The problem is uncontrolled. Pertinent negatives include no headaches or palpitations. Agents associated with hypertension include thyroid  hormones. Risk factors for coronary artery disease include diabetes mellitus, dyslipidemia, sedentary lifestyle, obesity and post-menopausal state. Past treatments include nothing. Compliance problems include diet and exercise.  Hypertensive end-organ damage includes kidney disease. Identifiable causes of hypertension include a thyroid  problem.   Review of systems  Constitutional: + slowly decreasing body weight,  current Body mass index is 32.99 kg/m. , no fatigue, no subjective hyperthermia, no subjective hypothermia Eyes: no blurry vision, no xerophthalmia ENT: no sore throat, no nodules palpated in throat, no dysphagia/odynophagia, no hoarseness Cardiovascular: no chest pain, no shortness of breath, no palpitations, no leg swelling Respiratory: no cough, no shortness of breath Gastrointestinal: no nausea/vomiting/diarrhea Musculoskeletal: no muscle/joint aches Skin: no rashes, no hyperemia Neurological: no tremors, no numbness, no tingling, no dizziness Psychiatric: no depression, no anxiety   Objective:    BP 124/68 (BP Location: Right Arm, Patient Position: Sitting, Cuff Size: Large)   Pulse 92    Ht 5\' 4"  (1.626 m)   Wt 192 lb 3.2 oz (87.2 kg)   BMI 32.99 kg/m   Wt Readings from Last 3 Encounters:  11/03/23 192 lb 3.2 oz (87.2 kg)  07/06/23 194 lb 9.6 oz (88.3 kg)  03/05/23 186 lb 3.2 oz (84.5 kg)    BP Readings from Last 3 Encounters:  11/03/23 124/68  07/06/23 (!) 144/88  03/05/23 137/70     Physical Exam- Limited  Constitutional:  Body mass index is 32.99 kg/m. , not in acute distress, normal state of mind Eyes:  EOMI, no exophthalmos Musculoskeletal: no gross deformities, strength intact in all four extremities, no gross restriction of joint movements Skin:  no rashes, no hyperemia Neurological: no tremor with outstretched hands   Diabetic Foot Exam - Simple   Simple Foot Form Diabetic Foot exam was performed with the following findings: Yes 11/03/2023 11:12 AM  Visual Inspection No deformities, no ulcerations, no other skin breakdown bilaterally: Yes Sensation Testing Intact to touch and monofilament testing bilaterally: Yes Pulse Check Posterior Tibialis and Dorsalis pulse intact bilaterally: Yes Comments     Recent Results (from the past 2160 hours)  TSH     Status: None   Collection Time: 10/25/23  9:08 AM  Result Value Ref Range   TSH 1.600 0.450 - 4.500 uIU/mL  T4, free  Status: None   Collection Time: 10/25/23  9:08 AM  Result Value Ref Range   Free T4 1.42 0.82 - 1.77 ng/dL  Comprehensive metabolic panel     Status: Abnormal   Collection Time: 10/25/23  9:08 AM  Result Value Ref Range   Glucose 140 (H) 70 - 99 mg/dL   BUN 12 8 - 27 mg/dL   Creatinine, Ser 5.40 (H) 0.57 - 1.00 mg/dL   eGFR 42 (L) >98 JX/BJY/7.82   BUN/Creatinine Ratio 9 (L) 12 - 28   Sodium 144 134 - 144 mmol/L   Potassium 4.9 3.5 - 5.2 mmol/L   Chloride 109 (H) 96 - 106 mmol/L   CO2 21 20 - 29 mmol/L   Calcium  8.9 8.7 - 10.3 mg/dL   Total Protein 6.8 6.0 - 8.5 g/dL   Albumin 4.2 3.8 - 4.8 g/dL   Globulin, Total 2.6 1.5 - 4.5 g/dL   Bilirubin Total 0.3 0.0 - 1.2  mg/dL   Alkaline Phosphatase 114 44 - 121 IU/L   AST 22 0 - 40 IU/L   ALT 20 0 - 32 IU/L  POCT UA - Microalbumin     Status: Abnormal   Collection Time: 11/03/23 11:10 AM  Result Value Ref Range   Microalbumin Ur, POC 80 mg/L mg/L   Creatinine, POC 300 mg/dL mg/dL   Albumin/Creatinine Ratio, Urine, POC 30-300 mg/G   HgB A1c     Status: Abnormal   Collection Time: 11/03/23 11:11 AM  Result Value Ref Range   Hemoglobin A1C 6.4 (A) 4.0 - 5.6 %   HbA1c POC (<> result, manual entry)     HbA1c, POC (prediabetic range)     HbA1c, POC (controlled diabetic range)        Assessment & Plan:   1) 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    She presents today, accompanied by her husband, with her meter and logs showing at target glycemic profile overall.  Her POCT A1c today is 6.4%, improving from last visit of 6.7%.  Analysis of her meter shows 7-day average of 115, 14-day average of 118, 30-day average of 116, 90-day average of 119.  She has tolerated the Mounjaro  well, ready to increase to the next dose.   -her diabetes is complicated by stage 3 renal insufficiency,obesity/sedentary life and she remains at a high risk for more acute and chronic complications which include CAD, CVA, CKD, retinopathy, and neuropathy. These are all discussed in detail with her.  -Recent labs reviewed.  POCT UM shows mild microalbuminuria today- consistent with her level of kidney impairment (CKD3).  - Nutritional counseling repeated at each appointment due to patients tendency to fall back in to old habits.  - The patient admits there is a room for improvement in their diet and drink choices. -  Suggestion is made for the patient to avoid simple carbohydrates from their diet including Cakes, Sweet Desserts / Pastries, Ice Cream, Soda (diet and regular), Sweet Tea, Candies, Chips, Cookies, Sweet  Pastries, Store Bought Juices, Alcohol in Excess of 1-2 drinks a day, Artificial Sweeteners, Coffee Creamer, and "Sugar-free" Products. This will help patient to have stable blood glucose profile and potentially avoid unintended weight gain.   - I encouraged the patient to switch to unprocessed or minimally processed complex starch and increased protein intake (animal or plant source), fruits, and vegetables.   - Patient  is advised to stick to a routine mealtimes to eat 3 meals a day and avoid unnecessary snacks (to snack only to correct hypoglycemia).  - I have approached her with the following individualized plan to manage diabetes and patient agrees:   -She is advised to increase Mounjaro  to 10 mg SQ weekly.  Has tolerated this medication very well, with no unpleasant side effects.  -She is advised to continue monitoring blood sugar at least once a day (1-2 times per week), and as needed if symptoms arise, and to call the clinic if she has readings less than 70 or greater than 200 for 3 tests in a row.  - she is not a candidate for SGLT2 inhibitors due to CKD.  2) BP/HTN:  Her blood pressure is at target today.  She is not currently on any antihypertensive medications at this time.  She does monitor BP at home routinely, and reports she typically has good results less than 140/90.    3) Hyperlipidemia Her most recent lipid panel from 06/08/22 shows controlled LDL of 13.  She is advised to continue Pravastatin 20 mg po daily.  4) Hypothyroidism-long-term diagnosis, took levothyroxine  for more than 10 years.  -Her previsit TFTs are consistent with appropriate hormone replacement.  She is advised to continue Levothyroxine  75 mcg po daily before breakfast.    - We discussed about the correct intake of her thyroid  hormone, on empty stomach at fasting, with water, separated by at least 30 minutes from breakfast and other medications,  and separated by more than 4 hours from calcium , iron,  multivitamins, acid reflux medications (PPIs). -Patient is made aware of the fact that thyroid  hormone replacement is needed for life, dose to be adjusted by periodic monitoring of thyroid  function tests.  5) Chronic Care/Health Maintenance: -she is encouraged to initiate and continue to follow up with Ophthalmology, Dentist,  Podiatrist at least yearly or according to recommendations, and advised to  stay away from smoking. I have recommended yearly flu vaccine and pneumonia vaccine at least every 5 years; moderate intensity exercise for up to 150 minutes weekly; and  sleep for at least 7 hours a day.  - I advised patient to maintain close follow up with Ava Lei, DO for primary care needs.     I spent  27  minutes in the care of the patient today including review of labs from CMP, Lipids, Thyroid  Function, Hematology (current and previous including abstractions from other facilities); face-to-face time discussing  her blood glucose readings/logs, discussing hypoglycemia and hyperglycemia episodes and symptoms, medications doses, her options of short and long term treatment based on the latest standards of care / guidelines;  discussion about incorporating lifestyle medicine;  and documenting the encounter. Risk reduction counseling performed per USPSTF guidelines to reduce obesity and cardiovascular risk factors.     Please refer to Patient Instructions for Blood Glucose Monitoring and Insulin/Medications Dosing Guide"  in media tab for additional information. Please  also refer to " Patient Self Inventory" in the Media  tab for reviewed elements of pertinent patient history.  Donal Frohlich participated in the discussions, expressed understanding, and voiced agreement with the above plans.  All questions were answered to her satisfaction. she is encouraged to contact clinic should she have any questions or concerns prior to her return visit.   Follow up plan: - Return in about 4 months (around  03/05/2024) for Diabetes F/U with A1c in office, No previsit labs.  Hulon Magic, FNP-BC [facility] [address] [place], [place] [address] Phone: [phone] Fax: [phone]   11/03/2023, 11:17 AM

## 2023-11-16 ENCOUNTER — Telehealth: Payer: Self-pay | Admitting: *Deleted

## 2023-11-16 NOTE — Telephone Encounter (Signed)
 Our office had received from Ald[place] with [facility] [phone] requesting that the patient's pharmacy be changed for diabetics supplies,lancets, from [facility] to [facility].  I called the patient to discuss, she said that the prescription was sent to [facility] then it was delayed due to insurance  and that it would need to come through [facility]. Then, patient got a call from the [facility] telling her that the prescription was ready for pick up. Patient has requested that we keep her prescriptions with the [facility].

## 2024-01-17 ENCOUNTER — Other Ambulatory Visit: Payer: Self-pay | Admitting: *Deleted

## 2024-01-17 DIAGNOSIS — Z7984 Long term (current) use of oral hypoglycemic drugs: Secondary | ICD-10-CM

## 2024-01-17 DIAGNOSIS — Z7985 Long-term (current) use of injectable non-insulin antidiabetic drugs: Secondary | ICD-10-CM

## 2024-01-17 DIAGNOSIS — E1122 Type 2 diabetes mellitus with diabetic chronic kidney disease: Secondary | ICD-10-CM

## 2024-01-17 MED ORDER — TIRZEPATIDE 10 MG/0.5ML ~~LOC~~ SOAJ: 10.0000 mg | SUBCUTANEOUS | 1 refills | Status: DC

## 2024-03-07 ENCOUNTER — Encounter: Payer: Self-pay | Admitting: Nurse Practitioner

## 2024-03-07 ENCOUNTER — Ambulatory Visit (INDEPENDENT_AMBULATORY_CARE_PROVIDER_SITE_OTHER): Admitting: Nurse Practitioner

## 2024-03-07 VITALS — BP 130/70 | HR 97 | Ht 64.0 in | Wt 187.4 lb

## 2024-03-07 DIAGNOSIS — Z7985 Long-term (current) use of injectable non-insulin antidiabetic drugs: Secondary | ICD-10-CM

## 2024-03-07 DIAGNOSIS — E782 Mixed hyperlipidemia: Secondary | ICD-10-CM

## 2024-03-07 DIAGNOSIS — Z7984 Long term (current) use of oral hypoglycemic drugs: Secondary | ICD-10-CM

## 2024-03-07 DIAGNOSIS — E1122 Type 2 diabetes mellitus with diabetic chronic kidney disease: Secondary | ICD-10-CM | POA: Diagnosis not present

## 2024-03-07 DIAGNOSIS — N1831 Chronic kidney disease, stage 3a: Secondary | ICD-10-CM

## 2024-03-07 DIAGNOSIS — E559 Vitamin D deficiency, unspecified: Secondary | ICD-10-CM

## 2024-03-07 DIAGNOSIS — E039 Hypothyroidism, unspecified: Secondary | ICD-10-CM

## 2024-03-07 DIAGNOSIS — I10 Essential (primary) hypertension: Secondary | ICD-10-CM

## 2024-03-07 DIAGNOSIS — N1832 Chronic kidney disease, stage 3b: Secondary | ICD-10-CM

## 2024-03-07 LAB — POCT GLYCOSYLATED HEMOGLOBIN (HGB A1C): Hemoglobin A1C: 6 % — AB (ref 4.0–5.6)

## 2024-03-07 MED ORDER — TIRZEPATIDE 10 MG/0.5ML ~~LOC~~ SOAJ: 10.0000 mg | SUBCUTANEOUS | 1 refills | Status: AC

## 2024-03-07 NOTE — Patient Instructions (Signed)

## 2024-03-07 NOTE — Progress Notes (Signed)
 03/07/2024, 11:43 AM          Endocrinology follow-up note   Subjective:    Patient ID: Carol Adams, female    DOB: [birth year].  Carol Adams is being seen in follow-up for management of currently uncontrolled symptomatic type 2 diabetes, hypothyroidism, hyperlipidemia, hypertension.  She is accompanied by her husband today.  PMD:   Lonna Millman, DO.   Past Medical History:  Diagnosis Date   Diabetes mellitus, type II (HCC)    Hypothyroidism    Past Surgical History:  Procedure Laterality Date   ABDOMINAL HYSTERECTOMY     CHOLECYSTECTOMY     Social History   Socioeconomic History   Marital status: Married    Spouse name: Not on file   Number of children: Not on file   Years of education: Not on file   Highest education level: Not on file  Occupational History   Not on file  Tobacco Use   Smoking status: Never   Smokeless tobacco: Never  Vaping Use   Vaping status: Never Used  Substance and Sexual Activity   Alcohol use: Never   Drug use: Never   Sexual activity: Not on file  Other Topics Concern   Not on file  Social History Narrative   Not on file   Social Drivers of Health   Financial Resource Strain: Not on file  Food Insecurity: Not on file  Transportation Needs: Not on file  Physical Activity: Not on file  Stress: Not on file  Social Connections: Not on file   Outpatient Encounter Medications as of 03/07/2024  Medication Sig   Accu-Chek FastClix Lancets MISC USE 1 TO 2 LANCETS TWICE DAILY   cholecalciferol (VITAMIN D3) 25 MCG (1000 UNIT) tablet Take 5,000 Units by mouth daily.   glucose blood (ACCU-CHEK GUIDE TEST) test strip Use as instructed to monitor glucose once daily   levothyroxine  (SYNTHROID ) 75 MCG tablet Take 1 tablet (75 mcg total) by mouth daily before breakfast.   loratadine (CLARITIN) 10 MG tablet Take 10 mg by mouth daily.   Multiple Vitamin (MULTIVITAMIN) capsule Take 1 capsule by mouth daily.   omeprazole   (PRILOSEC) 20 MG capsule Take 1 capsule (20 mg total) by mouth daily.   PARoxetine (PAXIL) 20 MG tablet Take 20 mg by mouth daily.   pravastatin (PRAVACHOL) 20 MG tablet Take 20 mg by mouth daily.   trandolapril (MAVIK) 4 MG tablet daily as needed.   [DISCONTINUED] tirzepatide  (MOUNJARO ) 10 MG/0.5ML Pen Inject 10 mg into the skin once a week.   tirzepatide  (MOUNJARO ) 10 MG/0.5ML Pen Inject 10 mg into the skin once a week.   No facility-administered encounter medications on file as of 03/07/2024.    ALLERGIES: Allergies  Allergen Reactions   Cephalexin Rash    VACCINATION STATUS: Immunization History  Administered Date(s) Administered   PFIZER(Purple Top)SARS-COV-2 Vaccination 09/16/2019, 10/07/2019    Diabetes She presents for her follow-up diabetic visit. She has type 2 diabetes mellitus. Onset time: She was recently diagnosed with type 2 diabetes at age 65 years. Her disease course has been improving. There are no hypoglycemic associated symptoms. Pertinent negatives for hypoglycemia include no confusion, pallor or seizures. Pertinent negatives for diabetes include no polydipsia, no polyphagia, no polyuria and no weight loss. There are no hypoglycemic complications. Symptoms are stable. Diabetic complications include nephropathy. Risk factors for coronary artery disease include diabetes mellitus, dyslipidemia, hypertension, obesity, sedentary lifestyle and post-menopausal. Current diabetic treatments: Mounjaro  only. She  is compliant with treatment all of the time. Her weight is decreasing steadily. She is following a diabetic diet. When asked about meal planning, she reported none. She has not had a previous visit with a dietitian. She participates in exercise intermittently. Her home blood glucose trend is decreasing steadily. Her overall blood glucose range is 110-130 mg/dl. (She presents today, accompanied by her husband, with her meter and logs showing at target glycemic profile overall.   Her POCT A1c today is 6%, improving from last visit of 6.4%.  Analysis of her meter shows 7-day average of 115, 14-day average of 118, 30-day average of 124, 90-day average of 126.  She has tolerated the Mounjaro  well no adverse side effects.  ) An ACE inhibitor/angiotensin II receptor blocker is not being taken. She does not see a podiatrist.Eye exam is current.   Review of systems  Constitutional: + slowly decreasing body weight,  current Body mass index is 32.17 kg/m. , no fatigue, no subjective hyperthermia, no subjective hypothermia Eyes: no blurry vision, no xerophthalmia ENT: no sore throat, no nodules palpated in throat, no dysphagia/odynophagia, no hoarseness Cardiovascular: no chest pain, no shortness of breath, no palpitations, no leg swelling Respiratory: no cough, no shortness of breath Gastrointestinal: no nausea/vomiting/diarrhea Musculoskeletal: no muscle/joint aches Skin: no rashes, no hyperemia Neurological: no tremors, no numbness, no tingling, no dizziness Psychiatric: no depression, no anxiety   Objective:    BP 130/70 (BP Location: Right Arm, Patient Position: Sitting, Cuff Size: Large)   Pulse 97   Ht 5' 4 (1.626 m)   Wt 187 lb 6.4 oz (85 kg)   BMI 32.17 kg/m   Wt Readings from Last 3 Encounters:  03/07/24 187 lb 6.4 oz (85 kg)  11/03/23 192 lb 3.2 oz (87.2 kg)  07/06/23 194 lb 9.6 oz (88.3 kg)    BP Readings from Last 3 Encounters:  03/07/24 130/70  11/03/23 124/68  07/06/23 (!) 144/88     Physical Exam- Limited  Constitutional:  Body mass index is 32.17 kg/m. , not in acute distress, normal state of mind Eyes:  EOMI, no exophthalmos Musculoskeletal: no gross deformities, strength intact in all four extremities, no gross restriction of joint movements Skin:  no rashes, no hyperemia Neurological: no tremor with outstretched hands   Diabetic Foot Exam - Simple   No data filed     Recent Results (from the past 2160 hours)  HgB A1c      Status: Abnormal   Collection Time: 03/07/24 11:36 AM  Result Value Ref Range   Hemoglobin A1C 6.0 (A) 4.0 - 5.6 %   HbA1c POC (<> result, manual entry)     HbA1c, POC (prediabetic range)     HbA1c, POC (controlled diabetic range)         Assessment & Plan:   1) 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    She presents today, accompanied by her husband, with her meter and logs showing at target glycemic profile overall.  Her POCT A1c today is 6%, improving from last visit of 6.4%.  Analysis of her meter shows 7-day average of 115, 14-day average of 118, 30-day average of 124, 90-day average of 126.  She has tolerated the Mounjaro  well no adverse side effects.    -her diabetes is complicated by stage 3 renal insufficiency,obesity/sedentary life and she remains at a high risk for more acute  and chronic complications which include CAD, CVA, CKD, retinopathy, and neuropathy. These are all discussed in detail with her.  -Recent labs reviewed.   - Nutritional counseling repeated at each appointment due to patients tendency to fall back in to old habits.  - The patient admits there is a room for improvement in their diet and drink choices. -  Suggestion is made for the patient to avoid simple carbohydrates from their diet including Cakes, Sweet Desserts / Pastries, Ice Cream, Soda (diet and regular), Sweet Tea, Candies, Chips, Cookies, Sweet Pastries, Store Bought Juices, Alcohol in Excess of 1-2 drinks a day, Artificial Sweeteners, Coffee Creamer, and Sugar-free Products. This will help patient to have stable blood glucose profile and potentially avoid unintended weight gain.   - I encouraged the patient to switch to unprocessed or minimally processed complex starch and increased protein intake (animal or plant source), fruits, and vegetables.   - Patient is advised to stick to  a routine mealtimes to eat 3 meals a day and avoid unnecessary snacks (to snack only to correct hypoglycemia).  - I have approached her with the following individualized plan to manage diabetes and patient agrees:   -She is advised to continue Mounjaro  10 mg SQ weekly.  Has tolerated this medication very well, with no unpleasant side effects.  -She is advised to continue monitoring blood sugar at least once a day (1-2 times per week), and as needed if symptoms arise, and to call the clinic if she has readings less than 70 or greater than 200 for 3 tests in a row.  - she is not a candidate for SGLT2 inhibitors due to CKD.  2) BP/HTN:  Her blood pressure is at target today.  She is not currently on any antihypertensive medications at this time.  She does monitor BP at home routinely, and reports she typically has good results less than 140/90.    3) Hyperlipidemia Her most recent lipid panel from 06/08/22 shows controlled LDL of 13.  She is advised to continue Pravastatin 20 mg po daily.  4) Hypothyroidism-long-term diagnosis, took levothyroxine  for more than 10 years.  -There are no recent TFTs to review.  She is advised to continue Levothyroxine  75 mcg po daily before breakfast. Will recheck prior to next visit and adjust dose accordingly.   - We discussed about the correct intake of her thyroid  hormone, on empty stomach at fasting, with water, separated by at least 30 minutes from breakfast and other medications,  and separated by more than 4 hours from calcium , iron, multivitamins, acid reflux medications (PPIs). -Patient is made aware of the fact that thyroid  hormone replacement is needed for life, dose to be adjusted by periodic monitoring of thyroid  function tests.  5) Chronic Care/Health Maintenance: -she is encouraged to initiate and continue to follow up with Ophthalmology, Dentist,  Podiatrist at least yearly or according to recommendations, and advised to  stay away from smoking. I  have recommended yearly flu vaccine and pneumonia vaccine at least every 5 years; moderate intensity exercise for up to 150 minutes weekly; and  sleep for at least 7 hours a day.  - I advised patient to maintain close follow up with Lonna Millman, DO for primary care needs.    I spent  20  minutes in the care of the patient today including review of labs from CMP, Lipids, Thyroid  Function, Hematology (current and previous including abstractions from other facilities); face-to-face time discussing  her blood glucose readings/logs, discussing hypoglycemia and  hyperglycemia episodes and symptoms, medications doses, her options of short and long term treatment based on the latest standards of care / guidelines;  discussion about incorporating lifestyle medicine;  and documenting the encounter. Risk reduction counseling performed per USPSTF guidelines to reduce obesity and cardiovascular risk factors.     Please refer to Patient Instructions for Blood Glucose Monitoring and Insulin/Medications Dosing Guide  in media tab for additional information. Please  also refer to  Patient Self Inventory in the Media  tab for reviewed elements of pertinent patient history.  Carol Adams participated in the discussions, expressed understanding, and voiced agreement with the above plans.  All questions were answered to her satisfaction. she is encouraged to contact clinic should she have any questions or concerns prior to her return visit.   Follow up plan: - Return in about 4 months (around 07/07/2024) for Diabetes F/U with A1c in office, Previsit labs, Thyroid  follow up.  Carol Adams, [facility] [address] [place], [place] [address] Phone: [phone] Fax: [phone]   03/07/2024, 11:43 AM

## 2024-04-25 ENCOUNTER — Other Ambulatory Visit: Payer: Self-pay | Admitting: Nurse Practitioner

## 2024-04-25 DIAGNOSIS — E039 Hypothyroidism, unspecified: Secondary | ICD-10-CM

## 2024-04-25 DIAGNOSIS — E1122 Type 2 diabetes mellitus with diabetic chronic kidney disease: Secondary | ICD-10-CM

## 2024-06-23 LAB — HEMOGLOBIN A1C: Hemoglobin A1C: 5.5

## 2024-07-04 LAB — COMPREHENSIVE METABOLIC PANEL WITH GFR
ALT: 21 IU/L (ref 0–32)
AST: 23 IU/L (ref 0–40)
Albumin: 4 g/dL (ref 3.8–4.8)
Alkaline Phosphatase: 132 IU/L (ref 49–135)
BUN/Creatinine Ratio: 11 — ABNORMAL LOW (ref 12–28)
BUN: 12 mg/dL (ref 8–27)
Bilirubin Total: 0.6 mg/dL (ref 0.0–1.2)
CO2: 23 mmol/L (ref 20–29)
Calcium: 9 mg/dL (ref 8.7–10.3)
Chloride: 105 mmol/L (ref 96–106)
Creatinine, Ser: 1.07 mg/dL — ABNORMAL HIGH (ref 0.57–1.00)
Globulin, Total: 2.9 g/dL (ref 1.5–4.5)
Glucose: 111 mg/dL — ABNORMAL HIGH (ref 70–99)
Potassium: 5 mmol/L (ref 3.5–5.2)
Sodium: 143 mmol/L (ref 134–144)
Total Protein: 6.9 g/dL (ref 6.0–8.5)
eGFR: 53 mL/min/1.73 — ABNORMAL LOW

## 2024-07-04 LAB — T4, FREE: Free T4: 1.32 ng/dL (ref 0.82–1.77)

## 2024-07-04 LAB — TSH: TSH: 1.52 u[IU]/mL (ref 0.450–4.500)

## 2024-07-10 ENCOUNTER — Ambulatory Visit: Admitting: Nurse Practitioner

## 2024-07-10 ENCOUNTER — Encounter: Payer: Self-pay | Admitting: Nurse Practitioner

## 2024-07-10 VITALS — BP 138/88 | HR 72 | Ht 64.0 in | Wt 179.4 lb

## 2024-07-10 DIAGNOSIS — E1122 Type 2 diabetes mellitus with diabetic chronic kidney disease: Secondary | ICD-10-CM

## 2024-07-10 DIAGNOSIS — N1831 Chronic kidney disease, stage 3a: Secondary | ICD-10-CM | POA: Diagnosis not present

## 2024-07-10 DIAGNOSIS — E782 Mixed hyperlipidemia: Secondary | ICD-10-CM

## 2024-07-10 DIAGNOSIS — I10 Essential (primary) hypertension: Secondary | ICD-10-CM

## 2024-07-10 DIAGNOSIS — Z7984 Long term (current) use of oral hypoglycemic drugs: Secondary | ICD-10-CM | POA: Diagnosis not present

## 2024-07-10 DIAGNOSIS — E039 Hypothyroidism, unspecified: Secondary | ICD-10-CM

## 2024-07-10 DIAGNOSIS — Z7985 Long-term (current) use of injectable non-insulin antidiabetic drugs: Secondary | ICD-10-CM | POA: Diagnosis not present

## 2024-07-10 DIAGNOSIS — E559 Vitamin D deficiency, unspecified: Secondary | ICD-10-CM | POA: Diagnosis not present

## 2024-07-10 MED ORDER — LEVOTHYROXINE SODIUM 75 MCG PO TABS: 75.0000 ug | ORAL_TABLET | Freq: Every day | ORAL | 3 refills | Status: AC

## 2024-07-10 NOTE — Progress Notes (Signed)
 "                          07/10/2024, 11:27 AM          Endocrinology follow-up note   Subjective:    Patient ID: Carol Adams, female    DOB: 06/29/1946.  Carol Adams is being seen in follow-up for management of currently uncontrolled symptomatic type 2 diabetes, hypothyroidism, hyperlipidemia, hypertension.  She is accompanied by her husband today.  PMD:   Lonna Millman, DO.   Past Medical History:  Diagnosis Date   Diabetes mellitus, type II (HCC)    Hypothyroidism    Past Surgical History:  Procedure Laterality Date   ABDOMINAL HYSTERECTOMY     CHOLECYSTECTOMY     Social History   Socioeconomic History   Marital status: Married    Spouse name: Not on file   Number of children: Not on file   Years of education: Not on file   Highest education level: Not on file  Occupational History   Not on file  Tobacco Use   Smoking status: Never   Smokeless tobacco: Never  Vaping Use   Vaping status: Never Used  Substance and Sexual Activity   Alcohol use: Never   Drug use: Never   Sexual activity: Not on file  Other Topics Concern   Not on file  Social History Narrative   Not on file   Social Drivers of Health   Tobacco Use: Low Risk (07/10/2024)   Patient History    Smoking Tobacco Use: Never    Smokeless Tobacco Use: Never    Passive Exposure: Not on file  Financial Resource Strain: Not on file  Food Insecurity: Not on file  Transportation Needs: Not on file  Physical Activity: Not on file  Stress: Not on file  Social Connections: Not on file  Depression (EYV7-0): Not on file  Alcohol Screen: Not on file  Housing: Unknown (07/20/2023)   Received from [facility]   Epic    Unable to Pay for Housing in the Last Year: Not on file    Number of Times Moved in the Last Year: Not on file    At any time in the past 12 months, were you homeless or living in a shelter (including now)?: No  Utilities: Not on file  Health Literacy: Not on file    Outpatient Encounter Medications as of 07/10/2024  Medication Sig   Accu-Chek FastClix Lancets MISC USE 1 TO 2 LANCETS TWICE DAILY   cholecalciferol (VITAMIN D3) 25 MCG (1000 UNIT) tablet Take 5,000 Units by mouth daily.   ferrous sulfate 324 MG TBEC Take 324 mg by mouth every other day.   glucose blood (ACCU-CHEK GUIDE TEST) test strip Use as instructed to monitor glucose once daily   loratadine (CLARITIN) 10 MG tablet Take 10 mg by mouth daily.   Multiple Vitamin (MULTIVITAMIN) capsule Take 1 capsule by mouth daily.   omeprazole  (PRILOSEC) 20 MG capsule TAKE 1 CAPSULE BY MOUTH DAILY   PARoxetine (PAXIL) 20 MG tablet Take 20 mg by mouth daily.   pravastatin (PRAVACHOL) 20 MG tablet Take 20 mg by mouth daily.   tirzepatide  (MOUNJARO ) 10 MG/0.5ML Pen Inject 10 mg into the skin once a week.   trandolapril (MAVIK) 4 MG tablet daily as needed.   [DISCONTINUED] levothyroxine  (SYNTHROID ) 75 MCG tablet TAKE 1 TABLET BY MOUTH DAILY  BEFORE BREAKFAST   levothyroxine  (SYNTHROID ) 75 MCG tablet Take 1  tablet (75 mcg total) by mouth daily before breakfast.   No facility-administered encounter medications on file as of 07/10/2024.    ALLERGIES: Allergies  Allergen Reactions   Cephalexin Rash    VACCINATION STATUS: Immunization History  Administered Date(s) Administered   PFIZER(Purple Top)SARS-COV-2 Vaccination 09/16/2019, 10/07/2019    Diabetes She presents for her follow-up diabetic visit. She has type 2 diabetes mellitus. Onset time: She was recently diagnosed with type 2 diabetes at age 22 years. Her disease course has been improving. There are no hypoglycemic associated symptoms. Pertinent negatives for hypoglycemia include no confusion, pallor or seizures. Pertinent negatives for diabetes include no polydipsia, no polyphagia, no polyuria and no weight loss. There are no hypoglycemic complications. Symptoms are stable. Diabetic complications include nephropathy. Risk factors for coronary  artery disease include diabetes mellitus, dyslipidemia, hypertension, obesity, sedentary lifestyle and post-menopausal. Current diabetic treatments: Mounjaro  only. She is compliant with treatment all of the time. Her weight is decreasing steadily. She is following a diabetic diet. When asked about meal planning, she reported none. She has not had a previous visit with a dietitian. She participates in exercise intermittently. Her overall blood glucose range is 110-130 mg/dl. (She presents today, with her meter and logs showing at target glycemic profile overall.  Her most recent A1c, checked by PCP on 12/26 was 5.5%, improving from last visit of 6%.  She continues to tolerate the Mounjaro  well with no adverse side effects.  ) An ACE inhibitor/angiotensin II receptor blocker is not being taken. She does not see a podiatrist.Eye exam is current.   Review of systems  Constitutional: +decreasing body weight,  current Body mass index is 30.79 kg/m. , + fatigue, no subjective hyperthermia, no subjective hypothermia Eyes: no blurry vision, no xerophthalmia ENT: no sore throat, no nodules palpated in throat, no dysphagia/odynophagia, no hoarseness Cardiovascular: no chest pain, no shortness of breath, no palpitations, no leg swelling Respiratory: no cough, no shortness of breath Gastrointestinal: no nausea/vomiting/diarrhea Musculoskeletal: no muscle/joint aches Skin: no rashes, no hyperemia Neurological: no tremors, no numbness, no tingling, no dizziness Psychiatric: no depression, no anxiety   Objective:    BP 138/88 (BP Location: Left Arm, Patient Position: Sitting, Cuff Size: Large)   Pulse 72   Ht 5' 4 (1.626 m)   Wt 179 lb 6.4 oz (81.4 kg)   BMI 30.79 kg/m   Wt Readings from Last 3 Encounters:  07/10/24 179 lb 6.4 oz (81.4 kg)  03/07/24 187 lb 6.4 oz (85 kg)  11/03/23 192 lb 3.2 oz (87.2 kg)    BP Readings from Last 3 Encounters:  07/10/24 138/88  03/07/24 130/70  11/03/23 124/68     Physical Exam- Limited  Constitutional:  Body mass index is 30.79 kg/m. , not in acute distress, normal state of mind Eyes:  EOMI, no exophthalmos Musculoskeletal: no gross deformities, strength intact in all four extremities, no gross restriction of joint movements Skin:  no rashes, no hyperemia Neurological: no tremor with outstretched hands   Diabetic Foot Exam - Simple   No data filed     Recent Results (from the past 2160 hours)  Hemoglobin A1c     Status: None   Collection Time: 06/23/24 12:00 AM  Result Value Ref Range   Hemoglobin A1C 5.5   Comprehensive metabolic panel with GFR     Status: Abnormal   Collection Time: 07/03/24 10:24 AM  Result Value Ref Range   Glucose 111 (H) 70 - 99 mg/dL   BUN 12 8 -  27 mg/dL   Creatinine, Ser 8.92 (H) 0.57 - 1.00 mg/dL   eGFR 53 (L) >40 fO/fpw/8.26   BUN/Creatinine Ratio 11 (L) 12 - 28   Sodium 143 134 - 144 mmol/L   Potassium 5.0 3.5 - 5.2 mmol/L   Chloride 105 96 - 106 mmol/L   CO2 23 20 - 29 mmol/L   Calcium  9.0 8.7 - 10.3 mg/dL   Total Protein 6.9 6.0 - 8.5 g/dL   Albumin 4.0 3.8 - 4.8 g/dL   Globulin, Total 2.9 1.5 - 4.5 g/dL   Bilirubin Total 0.6 0.0 - 1.2 mg/dL   Alkaline Phosphatase 132 49 - 135 IU/L   AST 23 0 - 40 IU/L   ALT 21 0 - 32 IU/L  TSH     Status: None   Collection Time: 07/03/24 10:24 AM  Result Value Ref Range   TSH 1.520 0.450 - 4.500 uIU/mL  T4, free     Status: None   Collection Time: 07/03/24 10:24 AM  Result Value Ref Range   Free T4 1.32 0.82 - 1.77 ng/dL       Assessment & Plan:   1) 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    She presents today, with her meter and logs showing at target glycemic profile overall.  Her most recent A1c, checked by PCP on 12/26 was 5.5%, improving from last visit of 6%.  She continues to tolerate the Mounjaro  well with no adverse side  effects.   -her diabetes is complicated by stage 3 renal insufficiency,obesity/sedentary life and she remains at a high risk for more acute and chronic complications which include CAD, CVA, CKD, retinopathy, and neuropathy. These are all discussed in detail with her.  -Recent labs reviewed.   - Nutritional counseling repeated/built upon at each appointment.  - The patient admits there is a room for improvement in their diet and drink choices. -  Suggestion is made for the patient to avoid simple carbohydrates from their diet including Cakes, Sweet Desserts / Pastries, Ice Cream, Soda (diet and regular), Sweet Tea, Candies, Chips, Cookies, Sweet Pastries, Store Bought Juices, Alcohol in Excess of 1-2 drinks a day, Artificial Sweeteners, Coffee Creamer, and Sugar-free Products. This will help patient to have stable blood glucose profile and potentially avoid unintended weight gain.   - I encouraged the patient to switch to unprocessed or minimally processed complex starch and increased protein intake (animal or plant source), fruits, and vegetables.   - Patient is advised to stick to a routine mealtimes to eat 3 meals a day and avoid unnecessary snacks (to snack only to correct hypoglycemia).  - I have approached her with the following individualized plan to manage diabetes and patient agrees:   -She is advised to continue Mounjaro  10 mg SQ weekly.  Has tolerated this medication very well, with no unpleasant side effects.  -She is advised to continue monitoring blood sugar at least once a day (1-2 times per week), and as needed if symptoms arise, and to call the clinic if she has readings less than 70 or greater than 200 for 3 tests in a row.  - she is not a candidate for SGLT2 inhibitors due to CKD.  2) BP/HTN:  Her blood pressure is at target today.  She is not currently on any antihypertensive medications at this time.  She does monitor BP at home routinely, and  reports she typically has  good results less than 140/90.    3) Hyperlipidemia Her most recent lipid panel from 06/08/22 shows controlled LDL of 13.  She is advised to continue Pravastatin 20 mg po daily.  4) Hypothyroidism-long-term diagnosis, took levothyroxine  for more than 10 years.  -Her previsit TFTs are consistent with appropriate hormone replacement.  She is advised to continue Levothyroxine  75 mcg po daily before breakfast. Will recheck prior to next visit and adjust dose accordingly.   - We discussed about the correct intake of her thyroid  hormone, on empty stomach at fasting, with water, separated by at least 30 minutes from breakfast and other medications,  and separated by more than 4 hours from calcium , iron, multivitamins, acid reflux medications (PPIs). -Patient is made aware of the fact that thyroid  hormone replacement is needed for life, dose to be adjusted by periodic monitoring of thyroid  function tests.  5) Chronic Care/Health Maintenance: -she is encouraged to initiate and continue to follow up with Ophthalmology, Dentist,  Podiatrist at least yearly or according to recommendations, and advised to  stay away from smoking. I have recommended yearly flu vaccine and pneumonia vaccine at least every 5 years; moderate intensity exercise for up to 150 minutes weekly; and  sleep for at least 7 hours a day.  - I advised patient to maintain close follow up with Lonna Millman, DO for primary care needs.     I spent  25  minutes in the care of the patient today including review of labs from CMP, Lipids, Thyroid  Function, Hematology (current and previous including abstractions from other facilities); face-to-face time discussing  her blood glucose readings/logs, discussing hypoglycemia and hyperglycemia episodes and symptoms, medications doses, her options of short and long term treatment based on the latest standards of care / guidelines;  discussion about incorporating lifestyle medicine;  and documenting the  encounter. Risk reduction counseling performed per USPSTF guidelines to reduce obesity and cardiovascular risk factors.     Please refer to Patient Instructions for Blood Glucose Monitoring and Insulin/Medications Dosing Guide  in media tab for additional information. Please  also refer to  Patient Self Inventory in the Media  tab for reviewed elements of pertinent patient history.  Winton Cools participated in the discussions, expressed understanding, and voiced agreement with the above plans.  All questions were answered to her satisfaction. she is encouraged to contact clinic should she have any questions or concerns prior to her return visit.   Follow up plan: - Return in about 6 months (around 01/07/2025) for Diabetes F/U with A1c in office, Thyroid  follow up, Previsit labs.  Benton Rio, [facility] [address] [place], [place] [address] Phone: [phone] Fax: [phone]   07/10/2024, 11:27 AM    "

## 2024-07-10 NOTE — Patient Instructions (Signed)

## 2024-07-19 ENCOUNTER — Other Ambulatory Visit: Payer: Self-pay | Admitting: Nurse Practitioner

## 2024-07-19 DIAGNOSIS — E039 Hypothyroidism, unspecified: Secondary | ICD-10-CM

## 2024-07-19 DIAGNOSIS — E1122 Type 2 diabetes mellitus with diabetic chronic kidney disease: Secondary | ICD-10-CM

## 2025-01-09 ENCOUNTER — Ambulatory Visit: Admitting: Nurse Practitioner
